# Patient Record
Sex: Male | Born: 1960
Health system: Southern US, Community
[De-identification: ages and names within clinical notes are randomized; demographics above are authoritative.]

## PROBLEM LIST (undated history)

## (undated) DIAGNOSIS — R51 Headache: Secondary | ICD-10-CM

## (undated) DIAGNOSIS — F329 Major depressive disorder, single episode, unspecified: Secondary | ICD-10-CM

## (undated) DIAGNOSIS — G4730 Sleep apnea, unspecified: Secondary | ICD-10-CM

## (undated) DIAGNOSIS — E785 Hyperlipidemia, unspecified: Secondary | ICD-10-CM

## (undated) DIAGNOSIS — Z87828 Personal history of other (healed) physical injury and trauma: Secondary | ICD-10-CM

## (undated) DIAGNOSIS — R569 Unspecified convulsions: Secondary | ICD-10-CM

## (undated) DIAGNOSIS — M21379 Foot drop, unspecified foot: Secondary | ICD-10-CM

## (undated) DIAGNOSIS — G40309 Generalized idiopathic epilepsy and epileptic syndromes, not intractable, without status epilepticus: Secondary | ICD-10-CM

## (undated) DIAGNOSIS — F29 Unspecified psychosis not due to a substance or known physiological condition: Secondary | ICD-10-CM

## (undated) DIAGNOSIS — R948 Abnormal results of function studies of other organs and systems: Secondary | ICD-10-CM

## (undated) DIAGNOSIS — F3289 Other specified depressive episodes: Secondary | ICD-10-CM

## (undated) DIAGNOSIS — R945 Abnormal results of liver function studies: Secondary | ICD-10-CM

## (undated) DIAGNOSIS — Z23 Encounter for immunization: Secondary | ICD-10-CM

## (undated) HISTORY — DX: Abnormal results of function studies of other organs and systems: R94.8

## (undated) HISTORY — DX: Generalized idiopathic epilepsy and epileptic syndromes, not intractable, without status epilepticus: G40.309

## (undated) HISTORY — DX: Abnormal results of liver function studies: R94.5

## (undated) HISTORY — DX: Headache: R51

## (undated) HISTORY — DX: Unspecified psychosis not due to a substance or known physiological condition: F29

## (undated) HISTORY — DX: Major depressive disorder, single episode, unspecified: F32.9

## (undated) HISTORY — DX: Sleep apnea, unspecified: G47.30

## (undated) HISTORY — DX: Personal history of other (healed) physical injury and trauma: Z87.828

## (undated) HISTORY — DX: Unspecified convulsions: R56.9

## (undated) HISTORY — DX: Foot drop, unspecified foot: M21.379

## (undated) HISTORY — DX: Encounter for immunization: Z23

## (undated) HISTORY — DX: Other specified depressive episodes: F32.89

## (undated) HISTORY — DX: Hyperlipidemia, unspecified: E78.5

---

## 1988-06-13 HISTORY — PX: NASAL SINUS SURGERY: SHX719

## 2007-01-17 ENCOUNTER — Emergency Department (HOSPITAL_COMMUNITY): Admission: EM | Admit: 2007-01-17 | Discharge: 2007-01-18 | Payer: Self-pay | Admitting: Emergency Medicine

## 2008-09-05 ENCOUNTER — Encounter: Admission: RE | Admit: 2008-09-05 | Discharge: 2008-09-05 | Payer: Self-pay | Admitting: Neurology

## 2010-12-02 ENCOUNTER — Other Ambulatory Visit: Payer: Self-pay | Admitting: Emergency Medicine

## 2010-12-02 ENCOUNTER — Ambulatory Visit
Admission: RE | Admit: 2010-12-02 | Discharge: 2010-12-02 | Disposition: A | Discharge status: Home / Self Care | Payer: 59 | Source: Ambulatory Visit | Attending: Emergency Medicine | Admitting: Emergency Medicine

## 2010-12-02 DIAGNOSIS — M5030 Other cervical disc degeneration, unspecified cervical region: Secondary | ICD-10-CM

## 2011-03-28 LAB — BASIC METABOLIC PANEL
BUN: 16
CO2: 25
Calcium: 9.1
Creatinine, Ser: 1
GFR calc Af Amer: >60

## 2011-03-28 LAB — RAPID URINE DRUG SCREEN, HOSP PERFORMED
Benzodiazepines: NOT DETECTED
Cocaine: NOT DETECTED
Opiates: NOT DETECTED
Tetrahydrocannabinol: POSITIVE — AB

## 2013-02-15 ENCOUNTER — Other Ambulatory Visit: Payer: Self-pay

## 2013-02-15 MED ORDER — CARBAMAZEPINE 200 MG PO TABS: 400.0000 mg | ORAL_TABLET | Freq: Two times a day (BID) | ORAL | Status: DC

## 2013-05-07 ENCOUNTER — Other Ambulatory Visit: Payer: Self-pay | Admitting: Diagnostic Neuroimaging

## 2013-05-08 NOTE — Telephone Encounter (Signed)
WID 

## 2013-06-11 ENCOUNTER — Other Ambulatory Visit: Payer: Self-pay | Admitting: Neurology

## 2013-06-16 NOTE — Telephone Encounter (Signed)
Former [facility] patient, has not been assigned new provider

## 2013-06-17 ENCOUNTER — Telehealth: Payer: Self-pay | Admitting: Neurology

## 2013-06-17 MED ORDER — CARBAMAZEPINE 200 MG PO TABS: 400.0000 mg | ORAL_TABLET | Freq: Two times a day (BID) | ORAL | Status: DC

## 2013-06-17 NOTE — Telephone Encounter (Signed)
Authorized one refill via WID while patient is waiting for call back regarding appt.

## 2013-06-17 NOTE — Telephone Encounter (Signed)
Gave to Donna for reassignment 

## 2013-06-17 NOTE — Telephone Encounter (Signed)
Patient called stating that he is former Dr. Sandria Kelley patient and no one ever called to reassign him to a new doctor. Patient wanted to schedule an appointment so that he can get Carbamazepine refills. Patient was given 7 day supply by pharmacy. Please call the patient.

## 2013-06-21 ENCOUNTER — Telehealth: Payer: Self-pay | Admitting: *Deleted

## 2013-06-21 MED ORDER — CARBAMAZEPINE 200 MG PO TABS: 400.0000 mg | ORAL_TABLET | Freq: Two times a day (BID) | ORAL | Status: DC

## 2013-06-21 NOTE — Telephone Encounter (Signed)
Rx has been sent  

## 2013-07-05 ENCOUNTER — Telehealth: Payer: Self-pay | Admitting: *Deleted

## 2013-07-05 NOTE — Telephone Encounter (Signed)
I called pt and there is a deadline for his DMV , I moved his appt up for 07-10-13 at 1030.  He will bring form with him.

## 2013-07-10 ENCOUNTER — Ambulatory Visit (INDEPENDENT_AMBULATORY_CARE_PROVIDER_SITE_OTHER): Payer: 59 | Admitting: Diagnostic Neuroimaging

## 2013-07-10 ENCOUNTER — Encounter: Payer: Self-pay | Admitting: Diagnostic Neuroimaging

## 2013-07-10 ENCOUNTER — Encounter (INDEPENDENT_AMBULATORY_CARE_PROVIDER_SITE_OTHER): Payer: Self-pay

## 2013-07-10 VITALS — BP 122/81 | HR 64 | Temp 97.9°F | Ht 71.0 in | Wt 190.0 lb

## 2013-07-10 DIAGNOSIS — G40109 Localization-related (focal) (partial) symptomatic epilepsy and epileptic syndromes with simple partial seizures, not intractable, without status epilepticus: Secondary | ICD-10-CM

## 2013-07-10 LAB — CBC WITH DIFFERENTIAL
BASOS: 0 %
Basophils Absolute: 0 10*3/uL (ref 0.0–0.2)
EOS ABS: 0.1 10*3/uL (ref 0.0–0.4)
Eos: 1 %
HEMATOCRIT: 42.3 % (ref 37.5–51.0)
HEMOGLOBIN: 15.2 g/dL (ref 12.6–17.7)
LYMPHS: 32 %
Lymphocytes Absolute: 2.3 10*3/uL (ref 0.7–3.1)
MCH: 32.8 pg (ref 26.6–33.0)
MCHC: 35.9 g/dL — AB (ref 31.5–35.7)
MCV: 91 fL (ref 79–97)
MONOCYTES: 8 %
Monocytes Absolute: 0.6 10*3/uL (ref 0.1–0.9)
NEUTROS ABS: 4.1 10*3/uL (ref 1.4–7.0)
Neutrophils Relative %: 59 %
Platelets: 248 10*3/uL (ref 150–379)
RBC: 4.64 x10E6/uL (ref 4.14–5.80)
RDW: 13.1 % (ref 12.3–15.4)
WBC: 7 10*3/uL (ref 3.4–10.8)

## 2013-07-10 LAB — COMPREHENSIVE METABOLIC PANEL
A/G RATIO: 2.1 (ref 1.1–2.5)
ALK PHOS: 82 IU/L (ref 39–117)
ALT: 37 IU/L (ref 0–44)
AST: 19 IU/L (ref 0–40)
Albumin: 5.1 g/dL (ref 3.5–5.5)
BILIRUBIN TOTAL: 0.3 mg/dL (ref 0.0–1.2)
BUN / CREAT RATIO: 19 (ref 9–20)
BUN: 13 mg/dL (ref 6–24)
CO2: 27 mmol/L (ref 18–29)
CREATININE: 0.68 mg/dL — AB (ref 0.76–1.27)
Calcium: 9.7 mg/dL (ref 8.7–10.2)
Chloride: 98 mmol/L (ref 96–108)
GFR calc Af Amer: 125 mL/min/{1.73_m2} (ref >59)
GFR, EST NON AFRICAN AMERICAN: 108 mL/min/{1.73_m2} (ref >59)
GLOBULIN, TOTAL: 2.4 g/dL (ref 1.5–4.5)
Glucose: 85 mg/dL (ref 65–99)
Potassium: 4.3 mmol/L (ref 3.5–5.2)
SODIUM: 135 mmol/L (ref 134–144)
Total Protein: 7.5 g/dL (ref 6.0–8.5)

## 2013-07-10 MED ORDER — CARBAMAZEPINE 200 MG PO TABS: 400.0000 mg | ORAL_TABLET | Freq: Two times a day (BID) | ORAL | Status: DC
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
## 2013-07-11 DIAGNOSIS — Z0289 Encounter for other administrative examinations: Secondary | ICD-10-CM

## 2013-09-11 ENCOUNTER — Ambulatory Visit: Payer: Self-pay | Admitting: Diagnostic Neuroimaging

## 2014-07-10 ENCOUNTER — Ambulatory Visit: Payer: 59 | Admitting: Diagnostic Neuroimaging

## 2014-08-01 ENCOUNTER — Encounter: Payer: Self-pay | Admitting: Diagnostic Neuroimaging

## 2014-08-01 ENCOUNTER — Ambulatory Visit (INDEPENDENT_AMBULATORY_CARE_PROVIDER_SITE_OTHER): Payer: 59 | Admitting: Diagnostic Neuroimaging

## 2014-08-01 VITALS — BP 115/81 | HR 77 | Ht 71.0 in | Wt 173.4 lb

## 2014-08-01 DIAGNOSIS — G40109 Localization-related (focal) (partial) symptomatic epilepsy and epileptic syndromes with simple partial seizures, not intractable, without status epilepticus: Secondary | ICD-10-CM

## 2014-08-01 NOTE — Patient Instructions (Signed)
Continue current medications. 
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
## 2014-08-07 ENCOUNTER — Telehealth: Payer: Self-pay | Admitting: *Deleted

## 2014-08-07 ENCOUNTER — Other Ambulatory Visit: Payer: Self-pay | Admitting: Diagnostic Neuroimaging

## 2014-08-07 NOTE — Telephone Encounter (Signed)
Called and left a message for the pt informing him that he still has three refills available according to our system. I told him to call the pharmacy and have them fill the next refill for him. Asked him to call back with any further questions.

## 2014-08-07 NOTE — Telephone Encounter (Signed)
Left a message for the pt and informed him that his medication was in fact a three month supply with 4 refills to last him a year until his next visit. I told him to call back with any further questions

## 2014-08-07 NOTE — Telephone Encounter (Signed)
Patient returned call and stated he received message, he has enough medication at this time.  He wanted to clarify that his Rx for carbamazepine (TEGRETOL) 200 MG tablet was dispensed as a 90 day supply instead of 30 day supply.  FYI

## 2014-09-22 ENCOUNTER — Other Ambulatory Visit: Payer: Self-pay | Admitting: Diagnostic Neuroimaging

## 2015-05-27 ENCOUNTER — Ambulatory Visit (INDEPENDENT_AMBULATORY_CARE_PROVIDER_SITE_OTHER): Payer: 59 | Admitting: Emergency Medicine

## 2015-05-27 VITALS — BP 116/70 | HR 69 | Temp 98.5°F | Resp 16 | Ht 70.0 in | Wt 179.8 lb

## 2015-05-27 DIAGNOSIS — Z23 Encounter for immunization: Secondary | ICD-10-CM

## 2015-05-27 DIAGNOSIS — L02219 Cutaneous abscess of trunk, unspecified: Secondary | ICD-10-CM

## 2015-05-27 MED ORDER — SULFAMETHOXAZOLE-TRIMETHOPRIM 800-160 MG PO TABS: 2.0000 | ORAL_TABLET | Freq: Two times a day (BID) | ORAL | Status: DC

## 2015-05-27 NOTE — Patient Instructions (Signed)

## 2015-05-27 NOTE — Progress Notes (Signed)
Subjective:  Patient ID: Logan Kelley, male    DOB: Nov 10, 1960  Age: 54 y.o. MRN: [id number]  CC: Abdominal Pain; other; and Immunizations   HPI Logan Kelley presents  with a concern that he has a hernia. He has a recent onset mass in his lower portion of his umbilicus that's tender. Yet last night it kept him up until 3:00 with pain and drainage he's been draining a purulent material. He denies any fever chills nausea vomiting has no abdominal pain per se. He has no history of injury. He works at a desk job.  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   He has past surgical history that includes Nasal sinus surgery (1990).   His  family history includes Cancer in his mother; Diabetes in his mother; Heart disease in his mother; Renal Disease in his father; Stroke in his father.  He   reports that he quit smoking about 37 years ago. He has never used smokeless tobacco. He reports that he does not drink alcohol or use illicit drugs.  Outpatient Prescriptions Prior to Visit  Medication Sig Dispense Refill  . carbamazepine (TEGRETOL) 200 MG tablet TAKE 2 TABLETS (400 MG TOTAL) BY MOUTH 2 (TWO) TIMES DAILY. 360 tablet 3  . cholecalciferol (VITAMIN D) 400 UNITS TABS tablet Take 400 Units by mouth.    . Glucosamine Sulfate 500 MG TABS Frequency:   Dosage:0.0     Instructions:  Note:    . Omega-3 Fatty Acids (FISH OIL) 1000 MG CAPS Frequency:   Dosage:0.0     Instructions:  Note:    . aspirin EC 81 MG tablet Take by mouth. Reported on 05/27/2015    . benzonatate (TESSALON) 200 MG capsule Reported on 05/27/2015  0  . fluticasone (FLONASE) 50 MCG/ACT nasal spray 1 spray by Each Nare route daily.    Marland Kitchen  guaiFENesin (MUCINEX) 600 MG 12 hr tablet Take 600 mg by mouth. Reported on 05/27/2015    . HYDROcodone-homatropine (HYCODAN) 5-1.5 MG/5ML syrup Take by mouth. Reported on 05/27/2015    . Multiple Vitamin (MULTIVITAMIN) capsule Reported on 05/27/2015    . Sodium Chloride-Sodium Bicarb (SINUFLO READYRINSE) KIT Reported on 05/27/2015  0   No facility-administered medications prior to visit.    Social History   Social History  . Marital Status: Married    Spouse Name: N/A  . Number of Children: 2  . Years of Education: 17   Occupational History  .     Social History Main Topics  . Smoking status: Former Smoker    Quit date: 07/10/1977  . Smokeless tobacco: Never Used  . Alcohol Use: No  . Drug Use: No  . Sexual Activity: Not Asked   Other Topics Concern  . None   Social History Narrative   Patient is right handed and resides in home with family     Review of Systems  Constitutional: Negative for fever, chills and appetite change.  HENT: Positive for postnasal drip, rhinorrhea and sinus pressure. Negative for congestion, ear pain and sore throat.   Eyes: Negative for pain and redness.  Respiratory: Positive for cough. Negative for shortness of breath and wheezing.   Cardiovascular: Negative for leg swelling.  Gastrointestinal: Negative for nausea, vomiting, abdominal pain, diarrhea, constipation and blood in stool.  Endocrine: Negative for polyuria.  Genitourinary: Negative for dysuria, urgency, frequency and flank pain.  Musculoskeletal: Negative for gait problem.  Skin: Positive for color change. Negative for rash.  Neurological: Negative for weakness and headaches.  Psychiatric/Behavioral: Negative for confusion and decreased concentration. The patient is not nervous/anxious.     Objective:  BP 116/70 mmHg  Pulse 69  Temp(Src) 98.5 F (36.9 C) (Oral)  Resp 16  Ht _0  (1.778 m)  Wt 179 lb 12.8 oz (81.557 kg)  BMI 25.80 kg/m2  SpO2 98%  Physical Exam    Constitutional: He is oriented to person, place, and time. He appears well-developed and well-nourished. No distress.  HENT:  Head: Normocephalic and atraumatic.  Right Ear: External ear normal.  Left Ear: External ear normal.  Nose: Nose normal.  Eyes: Conjunctivae and EOM are normal. Pupils are equal, round, and reactive to light. No scleral icterus.  Neck: Normal range of motion. Neck supple. No tracheal deviation present.  Cardiovascular: Normal rate, regular rhythm and normal heart sounds.   Pulmonary/Chest: Effort normal. No respiratory distress. He has no wheezes. He has no rales.  Abdominal: He exhibits no mass. There is no tenderness. There is no rebound and no guarding.  Musculoskeletal: He exhibits no edema.  Lymphadenopathy:    He has no cervical adenopathy.  Neurological: He is alert and oriented to person, place, and time. Coordination normal.  Skin: Skin is warm and dry. Rash noted. Rash is pustular.  Psychiatric: He has a normal mood and affect. His behavior is normal.    He has cellulitis surrounding the umbilicus with small abscess on the inferior portion of the umbilicus itself. It's freely freely draining pus is abdomen soft and nontender there is no mass  Assessment & Plan:   Logan Kelley was seen today for abdominal pain, other and immunizations.  Diagnoses and all orders for this visit:  Abscess of trunk  Needs flu shot -     Flu Vaccine QUAD 36+ mos IM  Other orders -     sulfamethoxazole-trimethoprim (BACTRIM DS,SEPTRA DS) 800-160 MG tablet; Take 2 tablets by mouth 2 (two) times daily.  I am having Logan Kelley start on sulfamethoxazole-trimethoprim. I am also having him maintain his aspirin EC, Fish Oil, fluticasone, Glucosamine Sulfate, guaiFENesin, HYDROcodone-homatropine, multivitamin, SINUFLO READYRINSE, benzonatate, cholecalciferol, and carbamazepine.  Meds ordered this encounter  Medications  . sulfamethoxazole-trimethoprim (BACTRIM DS,SEPTRA DS)  800-160 MG tablet    Sig: Take 2 tablets by mouth 2 (two) times daily.    Dispense:  40 tablet    Refill:  0    Appropriate red flag conditions were discussed with the patient as well as actions that should be taken.  Patient expressed his understanding.  Follow-up: Return if symptoms worsen or fail to improve.  Roselee Culver, MD

## 2015-08-07 ENCOUNTER — Ambulatory Visit: Payer: 59 | Admitting: Diagnostic Neuroimaging

## 2015-08-27 ENCOUNTER — Encounter: Payer: Self-pay | Admitting: Diagnostic Neuroimaging

## 2015-08-27 ENCOUNTER — Ambulatory Visit (INDEPENDENT_AMBULATORY_CARE_PROVIDER_SITE_OTHER): Payer: 59 | Admitting: Diagnostic Neuroimaging

## 2015-08-27 VITALS — BP 123/82 | HR 63 | Ht 70.5 in | Wt 181.2 lb

## 2015-08-27 DIAGNOSIS — G40109 Localization-related (focal) (partial) symptomatic epilepsy and epileptic syndromes with simple partial seizures, not intractable, without status epilepticus: Secondary | ICD-10-CM

## 2015-08-27 MED ORDER — CARBAMAZEPINE 200 MG PO TABS: 400.0000 mg | ORAL_TABLET | Freq: Two times a day (BID) | ORAL | Status: DC

## 2015-08-27 NOTE — Patient Instructions (Signed)
Thank you for coming to see [place] at [facility]. I hope we have been able to provide you high quality care today.  You may receive a patient satisfaction survey over the next few weeks. We would appreciate your feedback and comments so that we may continue to improve ourselves and the health of our patients.  - continue current medication   ~~~~~~~~~~~~~~~~~~~~~~~~~~~~~~~~~~~~~~~~~~~~~~~~~~~~~~~~~~~~~~~~~  DR. PENUMALLI'S GUIDE TO HAPPY AND HEALTHY LIVING These are some of my general health and wellness recommendations. Some of them may apply to you better than others. Please use common sense as you try these suggestions and feel free to ask me any questions.   ACTIVITY/FITNESS Mental, social, emotional and physical stimulation are very important for brain and body health. Try learning a new activity (arts, music, language, sports, games).  Keep moving your body to the best of your abilities. You can do this at home, inside or outside, the park, community center, gym or anywhere you like. Consider a physical therapist or personal trainer to get started. Consider the app Sworkit. Fitness trackers such as smart-watches, smart-phones or Fitbits can help as well.   NUTRITION Eat more plants: colorful vegetables, nuts, seeds and berries.  Eat less sugar, salt, preservatives and processed foods.  Avoid toxins such as cigarettes and alcohol.  Drink water when you are thirsty. Warm water with a slice of lemon is an excellent morning drink to start the day.  Consider these websites for more information The Nutrition Source (https://www.henry-hernandez.biz/) Precision Nutrition (WindowBlog.ch)   RELAXATION Consider practicing mindfulness meditation or other relaxation techniques such as deep breathing, prayer, yoga, tai chi, massage. See website mindful.org or the apps Headspace or Calm to help get started.   SLEEP Try to get at  least 7-8+ hours sleep per day. Regular exercise and reduced caffeine will help you sleep better. Practice good sleep hygeine techniques. See website sleep.org for more information.   PLANNING Prepare estate planning, living will, healthcare POA documents. Sometimes this is best planned with the help of an attorney. Theconversationproject.org and agingwithdignity.org are excellent resources.
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
## 2015-09-16 ENCOUNTER — Other Ambulatory Visit: Payer: Self-pay | Admitting: Diagnostic Neuroimaging

## 2015-11-02 ENCOUNTER — Encounter: Payer: Self-pay | Admitting: *Deleted

## 2015-11-02 DIAGNOSIS — Z0289 Encounter for other administrative examinations: Secondary | ICD-10-CM

## 2015-11-02 NOTE — Progress Notes (Unsigned)
DMV papers completed, sent to MR for processing.

## 2015-11-19 ENCOUNTER — Telehealth: Payer: Self-pay | Admitting: *Deleted

## 2015-11-19 NOTE — Telephone Encounter (Signed)
[facility]

## 2015-11-23 ENCOUNTER — Telehealth: Payer: Self-pay | Admitting: *Deleted

## 2015-11-23 NOTE — Telephone Encounter (Signed)
DMV papers completed, sent to MR for processing.

## 2015-11-25 ENCOUNTER — Telehealth: Payer: Self-pay | Admitting: *Deleted

## 2015-11-25 NOTE — Telephone Encounter (Signed)
[facility]

## 2016-08-08 ENCOUNTER — Telehealth: Payer: Self-pay | Admitting: *Deleted

## 2016-08-08 NOTE — Telephone Encounter (Signed)
LVM requesting patient call back and reschedule March FU due to dr being out of the office. Left office number and advised the phone staff may reschedule for him.

## 2016-08-26 ENCOUNTER — Ambulatory Visit: Payer: 59 | Admitting: Diagnostic Neuroimaging

## 2016-08-30 ENCOUNTER — Telehealth: Payer: Self-pay | Admitting: *Deleted
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
## 2016-08-31 ENCOUNTER — Ambulatory Visit (INDEPENDENT_AMBULATORY_CARE_PROVIDER_SITE_OTHER): Payer: BLUE CROSS/BLUE SHIELD | Admitting: Diagnostic Neuroimaging

## 2016-08-31 ENCOUNTER — Encounter: Payer: Self-pay | Admitting: Diagnostic Neuroimaging

## 2016-08-31 ENCOUNTER — Encounter: Payer: Self-pay | Admitting: *Deleted

## 2016-08-31 ENCOUNTER — Encounter (INDEPENDENT_AMBULATORY_CARE_PROVIDER_SITE_OTHER): Payer: Self-pay

## 2016-08-31 VITALS — BP 122/78 | HR 69 | Wt 184.0 lb

## 2016-08-31 DIAGNOSIS — G40109 Localization-related (focal) (partial) symptomatic epilepsy and epileptic syndromes with simple partial seizures, not intractable, without status epilepticus: Secondary | ICD-10-CM

## 2016-08-31 MED ORDER — CARBAMAZEPINE 200 MG PO TABS: 400.0000 mg | ORAL_TABLET | Freq: Two times a day (BID) | ORAL | 4 refills | Status: DC
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
## 2016-08-31 NOTE — Patient Instructions (Signed)
-   continue carbamazepine 

## 2017-03-06 ENCOUNTER — Ambulatory Visit: Payer: Self-pay

## 2017-03-06 ENCOUNTER — Ambulatory Visit: Payer: BLUE CROSS/BLUE SHIELD | Attending: Family Medicine | Admitting: Rehabilitation

## 2017-03-06 DIAGNOSIS — M6281 Muscle weakness (generalized): Secondary | ICD-10-CM | POA: Insufficient documentation

## 2017-03-06 DIAGNOSIS — M25511 Pain in right shoulder: Secondary | ICD-10-CM | POA: Diagnosis present

## 2017-03-06 NOTE — Therapy (Signed)
[facility] [address] [place], [place], [address] Phone: [phone]   Fax:  [phone]  Physical Therapy Evaluation  Patient Details  Name: MACKY GALIK MRN: [id number] Date of Birth: 17-Apr-1961 Referring Provider: Mare Loan, MD  Encounter Date: 03/06/2017      PT End of Session - 03/06/17 1731    Visit Number 1   Number of Visits 10   Date for PT Re-Evaluation 04/10/17   PT Start Time 1550   PT Stop Time 1630   PT Time Calculation (min) 40 min   Activity Tolerance Patient tolerated treatment well      Past Medical History:  Diagnosis Date  . Depressive disorder, not elsewhere classified   . 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 Headache(784.0)   . History of traumatic head injury    Age 56  . Liver function abnormality   . Nonspecific abnormal results of other specified function study   . Seizures (HCC)    08/31/16  last sz > 15 yrs  . Unspecified psychosis     Past Surgical History:  Procedure Laterality Date  . NASAL SINUS SURGERY  1990   polyps removed    There were no vitals filed for this visit.       Subjective Assessment - 03/06/17 1554    Subjective Pt presents with R shoulder pain and decreasing function x 2 weeks for no known reason.  Saw primary care MD who gave him naproxen and told him to ice and use heat.  Now having difficulty using the UE, lifting the arm, playing the guitar, and using the mouse at work.  Denies numbness and tingling, Has a TENS unit and is using antinflammatories.  Attempted pendulums and wall walking at home which aggravated the shoulder.     Limitations Lifting   Diagnostic tests none   Patient Stated Goals return to full use of the arm    Currently in Pain? Yes   Pain Score 2   up to 9/10   Pain Location Shoulder   Pain Orientation Right   Pain Descriptors / Indicators Aching;Throbbing   Pain Type Acute pain   Pain Onset 1  to 4 weeks ago   Pain Frequency Constant   Aggravating Factors  using the UE   Pain Relieving Factors ice, heat, TENS            [facility] PT Assessment - 03/06/17 0001      Assessment   Medical Diagnosis R shoulder pain   Referring Provider Mare Loan, MD   Onset Date/Surgical Date 02/20/17   Hand Dominance Right   Next MD Visit unknown   Prior Therapy no     Precautions   Precautions None     Restrictions   Weight Bearing Restrictions No     Balance Screen   Has the patient fallen in the past 6 months No     Home Environment   Living Environment Private residence     Prior Function   Level of Independence Independent   Vocation Requirements desk at [employer]   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     Observation/Other Assessments   Focus on Therapeutic Outcomes (FOTO)  not taken     ROM / Strength   AROM / PROM / Strength AROM;PROM;Strength     AROM   AROM Assessment Site Shoulder   Right/Left Shoulder Right   Right Shoulder Flexion 70 Degrees  pain   Right Shoulder ABduction 55 Degrees  pain   Right Shoulder Internal Rotation --  behind the back to T10 pain   Right Shoulder External Rotation --  behind head 85%     PROM   Overall PROM Comments all WNL without significant pain   PROM Assessment Site Shoulder   Right/Left Shoulder Right     Strength   Overall Strength Comments within available ROM   Strength Assessment Site Shoulder   Right/Left Shoulder Right   Right Shoulder Flexion 3+/5   Right Shoulder ABduction 3+/5   Right Shoulder Internal Rotation 4+/5   Right Shoulder External Rotation 3+/5     Palpation   Palpation comment 1 ttp supraspinatus and infraspinatus tendon insertions     Special Tests    Special Tests Rotator Cuff Impingement            Objective measurements completed on examination: See above findings.                  PT Education - 03/06/17 1731    Education provided Yes   Education  Details HEP, diagnosis, POC, when to see orthopedic surgeon   Person(s) Educated Patient   Methods Explanation;Demonstration;Verbal cues;Handout   Comprehension Verbalized understanding;Returned demonstration;Verbal cues required             PT Long Term Goals - 03/06/17 1738      PT LONG TERM GOAL #1   Title Pt will improve shoulder AROM to WNL    Time 5   Period Weeks   Status New   Target Date 04/10/17     PT LONG TERM GOAL #2   Title Pt will improve R shoulder strength to 4+/5 or greater   Time 5   Period Weeks   Status New   Target Date 04/10/17     PT LONG TERM GOAL #3   Title Pt will return to playing the guitar without limitations   Time 5   Period Weeks   Status New   Target Date 04/10/17                Plan - 03/06/17 1733    Clinical Impression Statement pt presents with new onset R shoulder pain x 2 weeks for no known reason.  The pain was initially very severe constantly but is now only severe at night and with use.  The R UE AROM is very limited against gravity into flexion, abduction with 3+/5 MMT flexion, abduction, and ER.  PROM is full but slightly painful.  Educated on attempting TE x 1 week with visit to orthopedic surgeon for imaging/follow-up if the ROM has not improved.  Pt demonstrating likely RTC at this point.     Clinical Presentation Stable   Clinical Decision Making Low   Rehab Potential Good   PT Frequency --  1-2 x per week    PT Duration --  5 weeks   PT Treatment/Interventions Manual techniques;Passive range of motion;Ultrasound;Iontophoresis /ml Dexamethasone;Electrical Stimulation;Cryotherapy;Moist Heat;Therapeutic exercise;Neuromuscular re-education   PT Next Visit Plan review HEP; assess AROM, begin ROM/R shoulder strengthening as tolerated.  Modalities as needed   PT Home Exercise Plan Given AAROM standing abduction, flexion, supine punch, supine cane flexion, supine active flexion   Recommended Other Services  orthopedic if no quick improvements in AROM   Consulted and Agree with Plan of Care Patient      Patient will benefit from skilled therapeutic intervention in order to improve the following deficits  and impairments:  Decreased range of motion, Pain, Decreased strength  Visit Diagnosis: Acute pain of right shoulder - Plan: PT plan of care cert/re-cert  Muscle weakness (generalized) - Plan: PT plan of care cert/re-cert     Problem List There are no active problems to display for this patient.   Idamae Lusher 03/06/2017, 5:43 PM  [facility] [address] [place], [place], [address] Phone: [phone]   Fax:  [phone]  Name: ZYHIR CAPPELLA MRN: [id number] Date of Birth: 1961/01/19

## 2017-03-06 NOTE — Patient Instructions (Signed)
   With no resistance

## 2017-03-16 ENCOUNTER — Ambulatory Visit: Payer: BLUE CROSS/BLUE SHIELD | Attending: Family Medicine

## 2017-03-16 DIAGNOSIS — M25611 Stiffness of right shoulder, not elsewhere classified: Secondary | ICD-10-CM | POA: Diagnosis present

## 2017-03-16 DIAGNOSIS — M25511 Pain in right shoulder: Secondary | ICD-10-CM | POA: Diagnosis present

## 2017-03-16 DIAGNOSIS — M6281 Muscle weakness (generalized): Secondary | ICD-10-CM | POA: Insufficient documentation

## 2017-03-16 NOTE — Therapy (Signed)
[facility] [address] [place], [place], [address] Phone: [phone]   Fax:  [phone]  Physical Therapy Treatment  Patient Details  Name: Logan Kelley MRN: [id number] Date of Birth: Nov 01, 1960 Referring Provider: Mare Loan, MD  Encounter Date: 03/16/2017      PT End of Session - 03/16/17 1727    Visit Number 2   Number of Visits 10   Date for PT Re-Evaluation 04/10/17   PT Start Time 0440   PT Stop Time 0520   PT Time Calculation (min) 40 min   Activity Tolerance Patient tolerated treatment well;No increased pain   Behavior During Therapy WFL for tasks assessed/performed      Past Medical History:  Diagnosis Date  . Depressive disorder, not elsewhere classified   . 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 Headache(784.0)   . History of traumatic head injury    Age 56  . Liver function abnormality   . Nonspecific abnormal results of other specified function study   . Seizures (HCC)    08/31/16  last sz > 15 yrs  . Unspecified psychosis     Past Surgical History:  Procedure Laterality Date  . NASAL SINUS SURGERY  1990   polyps removed    There were no vitals filed for this visit.      Subjective Assessment - 03/16/17 1643    Subjective Feels he is improving. Doing HEP.   Modified work set up. Not playing guitar now on weekends   Currently in Pain? No/denies  at rest, pain in AM now,                            [facility] Adult PT Treatment/Exercise - 03/16/17 0001      Exercises   Exercises Shoulder     Shoulder Exercises: Seated   Other Seated Exercises Reviewed initial HEP     Shoulder Exercises: Standing   Other Standing Exercises [place] yellow and red band x 12-15 reps with instruction for HEP      Shoulder Exercises: Stretch   Internal Rotation Stretch 20 seconds   Internal Rotation Stretch Limitations with towel instruction for HEP   Wall  Stretch - Flexion 2 reps;30 seconds   Wall Stretch - Flexion Limitations reaching and moving body into corner of walls instructed for HEP                PT Education - 03/16/17 1724    Education provided Yes   Education Details isued HEP for IR and ER /flex/abduction with towel and use of wall 2x/day 2-3 reps 30 sec or more and rockwood with yellow and red band  2x/day 10-15 reps NO PAIN.   Person(s) Educated Patient   Methods Explanation;Demonstration;Verbal cues;Handout;Tactile cues   Comprehension Verbalized understanding;Returned demonstration             PT Long Term Goals - 03/06/17 1738      PT LONG TERM GOAL #1   Title Pt will improve shoulder AROM to WNL    Time 5   Period Weeks   Status New   Target Date 04/10/17     PT LONG TERM GOAL #2   Title Pt will improve R shoulder strength to 4+/5 or greater   Time 5   Period Weeks   Status New   Target Date 04/10/17     PT LONG TERM GOAL #3  Title Pt will return to playing the guitar without limitations   Time 5   Period Weeks   Status New   Target Date 04/10/17               Plan - 03/16/17 1728    Clinical Impression Statement He is much improved with essentially no pain with ROM and passive motion WNL except reaching behind back.   He is still weak not able to lift arm to 90 degrees.  And having difficulty with flexion and ER with band exercises   PT Treatment/Interventions Manual techniques;Passive range of motion;Ultrasound;Iontophoresis /ml Dexamethasone;Electrical Stimulation;Cryotherapy;Moist Heat;Therapeutic exercise;Neuromuscular re-education   PT Next Visit Plan review HEP; assess AROM, begin ROM/R shoulder strengthening as tolerated.  Modalities as needed   PT Home Exercise Plan Given AAROM standing abduction, flexion, supine punch, supine cane flexion, supine active flexion, stretching overhead and behind back, rockwood   Consulted and Agree with Plan of Care Patient      Patient  will benefit from skilled therapeutic intervention in order to improve the following deficits and impairments:  Decreased range of motion, Pain, Decreased strength  Visit Diagnosis: Acute pain of right shoulder  Muscle weakness (generalized)     Problem List There are no active problems to display for this patient.   Caprice Red  PT 03/16/2017, 5:35 PM  [facility] [address] [place], [place], [address] Phone: [phone]   Fax:  [phone]  Name: Logan Kelley MRN: [id number] Date of Birth: Aug 20, 1960

## 2017-03-21 ENCOUNTER — Ambulatory Visit: Payer: BLUE CROSS/BLUE SHIELD

## 2017-03-21 DIAGNOSIS — M25611 Stiffness of right shoulder, not elsewhere classified: Secondary | ICD-10-CM

## 2017-03-21 DIAGNOSIS — M25511 Pain in right shoulder: Secondary | ICD-10-CM | POA: Diagnosis not present

## 2017-03-21 DIAGNOSIS — M6281 Muscle weakness (generalized): Secondary | ICD-10-CM

## 2017-03-21 NOTE — Therapy (Signed)
[place], [place], [address] Phone: [phone]   Fax:  [phone]  Physical Therapy Treatment  Patient Details  Name: Logan Kelley MRN: [id number] Date of Birth: 11/04/1960 Referring Provider: Marlane Mingle, MD  Encounter Date: 03/21/2017      PT End of Session - 03/21/17 1634    Visit Number 3   Number of Visits 10   Date for PT Re-Evaluation 04/10/17   PT Start Time 0435   PT Stop Time 0520   PT Time Calculation (min) 45 min   Activity Tolerance Patient tolerated treatment well   Behavior During Therapy [facility] for tasks assessed/performed      Past Medical History:  Diagnosis Date  . Depressive disorder, not elsewhere classified   . 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 Headache(784.0)   . History of traumatic head injury    Age 56  . Liver function abnormality   . Nonspecific abnormal results of other specified function study   . Seizures ([place])    08/31/16  last sz > 15 yrs  . Unspecified psychosis     Past Surgical History:  Procedure Laterality Date  . NASAL SINUS SURGERY  1990   polyps removed    There were no vitals filed for this visit.      Subjective Assessment - 03/21/17 1637    Subjective Pain after playing the guitar this weekend , No pain right now.   Currently in Pain? No/denies                         [facility] Adult PT Treatment/Exercise - 03/21/17 0001      Shoulder Exercises: Supine   Flexion Strengthening;Right;15 reps   Shoulder Flexion Weight (lbs) 2   Flexion Limitations cued for eeping elbow extended     Shoulder Exercises: Sidelying   Other Sidelying Exercises able to actively move full ROM but with 2 pound unable to go more than 1/3 of range and not able to hold in full range and control eccentrics     Shoulder Exercises: Standing   Other Standing Exercises rockwood with red band most trouble with ER.   Also added hand sbehind back abduction pulld red band for home x 15 reps      Shoulder Exercises: Pulleys   Flexion 2 minutes   Flexion Limitations overhead     Shoulder Exercises: ROM/Strengthening   UBE (Upper Arm Bike) L1 5 min forward     Manual Therapy   Manual Therapy Passive ROM   Passive ROM Range all planes . ROM WNLexcept reacg=ching behind back still 2 inches less on RT no incr pain                PT Education - 03/21/17 1725    Education provided Yes   Education Details HEP   Person(s) Educated Patient   Methods Explanation;Tactile cues;Verbal cues;Handout;Demonstration   Comprehension Returned demonstration;Verbalized understanding             PT Long Term Goals - 03/21/17 1727      PT LONG TERM GOAL #1   Title Pt will improve shoulder AROM to WNL    Status On-going     PT LONG TERM GOAL #2   Title Pt will improve R shoulder strength to 4+/5 or greater   Baseline ER and abduction /flexion still < 3/5   Status On-going     PT  LONG TERM GOAL #3   Title Pt will return to playing the guitar without limitations   Baseline some pain but managable with playing   Status Partially Met               Plan - 03/21/17 1634    Clinical Impression Statement No pain to start. Still stiff RT shoulder with ER. Able to lift some weight but abduction and ER still very weak and may have RTC tear.    PT Treatment/Interventions Manual techniques;Passive range of motion;Ultrasound;Iontophoresis 85m/ml Dexamethasone;Electrical Stimulation;Cryotherapy;Moist Heat;Therapeutic exercise;Neuromuscular re-education   PT Next Visit Plan  Next visit next Tuesday.  review HEP; assess AROM, R shoulder strengthening as tolerated.  Modalities as needed   PT Home Exercise Plan Given AAROM standing abduction, flexion, supine punch, supine cane flexion, supine active flexion, stretching overhead and behind back, rockwood, behind back abduction , supine flexion with weight , side  lye ER.    Consulted and Agree with Plan of Care Patient      Patient will benefit from skilled therapeutic intervention in order to improve the following deficits and impairments:  Decreased range of motion, Pain, Decreased strength  Visit Diagnosis: Muscle weakness (generalized)  Acute pain of right shoulder  Stiffness of right shoulder joint     Problem List There are no active problems to display for this patient.   CDarrel Hoover PT 03/21/2017, 5:29 PM  [facility] [address]Phone: [phone]  Fax:  [phone] Name: Logan DINOVOMRN: [id number] of Birth: [birth year]

## 2017-03-21 NOTE — Patient Instructions (Signed)
Written instruction for behind back abduction with band, ER on side and flexion in supine with can for strength 1-2x/day 10-20 reps , no pain

## 2017-03-28 ENCOUNTER — Ambulatory Visit: Payer: BLUE CROSS/BLUE SHIELD

## 2017-03-28 DIAGNOSIS — M25511 Pain in right shoulder: Secondary | ICD-10-CM | POA: Diagnosis not present

## 2017-03-28 DIAGNOSIS — M25611 Stiffness of right shoulder, not elsewhere classified: Secondary | ICD-10-CM

## 2017-03-28 DIAGNOSIS — M6281 Muscle weakness (generalized): Secondary | ICD-10-CM

## 2017-03-28 NOTE — Patient Instructions (Signed)
Written for side lying and prone hor abduction and prone extension  10-15 reps assisted as needed and attempting isometric holding.

## 2017-03-28 NOTE — Therapy (Signed)
[place], [place], [address] Phone: [phone]   Fax:  [phone]  Physical Therapy Treatment  Patient Details  Name: Logan Kelley MRN: [id number] Date of Birth: [birth year] Referring Provider: Marlane Mingle, MD  Encounter Date: 03/28/2017      PT End of Session - 03/28/17 1639    Visit Number 4   Number of Visits 10   Date for PT Re-Evaluation 04/10/17   PT Start Time 0435   PT Stop Time 0520   PT Time Calculation (min) 45 min   Activity Tolerance Patient tolerated treatment well   Behavior During Therapy [place] for tasks assessed/performed      Past Medical History:  Diagnosis Date  . Depressive disorder, not elsewhere classified   . 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 Headache(784.0)   . History of traumatic head injury    Age 56  . Liver function abnormality   . Nonspecific abnormal results of other specified function study   . Seizures ([place])    08/31/16  last sz > 15 yrs  . Unspecified psychosis     Past Surgical History:  Procedure Laterality Date  . NASAL SINUS SURGERY  1990   polyps removed    There were no vitals filed for this visit.      Subjective Assessment - 03/28/17 1638    Subjective Better in that i can raise arm without thinking now  and can play guitar without pain.   still hard to reach fully overhead and to ER.    Currently in Pain? No/denies                         [facility] Adult PT Treatment/Exercise - 03/28/17 0001      Shoulder Exercises: Supine   External Rotation Right;12 reps   Internal Rotation Right;12 reps   Internal Rotation Weight (lbs) 2   Internal Rotation Limitations cued for control   Flexion Right   Shoulder Flexion Weight (lbs) 2   Flexion Limitations cued for keeping elbow extended     Shoulder Exercises: Prone   Other Prone Exercises Extension full ROM active but decreased range with 1  and 2 pound weight.  and hor abduction unable to lift without assistance.      Shoulder Exercises: Sidelying   Other Sidelying Exercises able to actively move full ROM but with 1 and  2 pound unable to go more than 1/3 of range and not able to hold in full range and control eccentrics.. Also did horizontal abdcution  also with asssit and di 4-5 reps without assist but very weak.      Shoulder Exercises: Standing   Other Standing Exercises rockwood with green band most trouble with ER.  Also added hand sbehind back abduction pulld red band for home x 15 reps      Shoulder Exercises: ROM/Strengthening   UBE (Upper Arm Bike) l2 3 min forward , 3 min back                PT Education - 03/28/17 1735    Education provided Yes   Education Details HEP   Person(s) Educated Patient   Methods Explanation;Tactile cues;Verbal cues;Handout   Comprehension Returned demonstration;Verbalized understanding             PT Long Term Goals - 03/28/17 1737      PT LONG TERM GOAL #1   Status On-going  PT LONG TERM GOAL #2   Status On-going     PT LONG TERM GOAL #3   Title Pt will return to playing the guitar without limitations   Status Partially Met               Plan - 03/28/17 1639    Clinical Impression Statement Continues with weakness with flexion /abduction/ER. but functionally improveing with less pain. Suggested he speak to MD about testing for rotator cuff tear but to continue towork in PT for awhile to see if strength can be improved   PT Treatment/Interventions Manual techniques;Passive range of motion;Ultrasound;Iontophoresis 71m/ml Dexamethasone;Electrical Stimulation;Cryotherapy;Moist Heat;Therapeutic exercise;Neuromuscular re-education   PT Next Visit Plan continue strength and ROM    PT Home Exercise Plan Given AAROM standing abduction, flexion, supine punch, supine cane flexion, supine active flexion, stretching overhead and behind back, rockwood, behind back  abduction , supine flexion with weight , side lye ER.  and abduction  and hor abduction and prone ext and hor abd   Consulted and Agree with Plan of Care Patient      Patient will benefit from skilled therapeutic intervention in order to improve the following deficits and impairments:  Decreased range of motion, Pain, Decreased strength  Visit Diagnosis: Muscle weakness (generalized)  Acute pain of right shoulder  Stiffness of right shoulder joint     Problem List There are no active problems to display for this patient.   CDarrel Hoover PT 03/28/2017, 5:37 PM  [facility] [address]Phone: [phone]  Fax:  [phone] Name: Logan HAYASHIMRN: [id number] of Birth: [birth year]

## 2017-04-04 ENCOUNTER — Ambulatory Visit: Payer: BLUE CROSS/BLUE SHIELD

## 2017-04-04 DIAGNOSIS — M25611 Stiffness of right shoulder, not elsewhere classified: Secondary | ICD-10-CM

## 2017-04-04 DIAGNOSIS — M25511 Pain in right shoulder: Secondary | ICD-10-CM

## 2017-04-04 DIAGNOSIS — M6281 Muscle weakness (generalized): Secondary | ICD-10-CM

## 2017-04-04 NOTE — Therapy (Addendum)
[facility], [place], [address] Phone: [phone]   Fax:  [phone]  Physical Therapy Treatment/Discharge  Patient Details  Name: Logan Kelley MRN: [id number] Date of Birth: 26-May-1961 Referring Provider: Marlane Mingle, MD  Encounter Date: 04/04/2017      PT End of Session - 04/04/17 1737    Visit Number 5   Number of Visits 10   Date for PT Re-Evaluation 04/10/17   PT Start Time 0435   PT Stop Time 0515   PT Time Calculation (min) 40 min   Activity Tolerance Patient tolerated treatment well;No increased pain   Behavior During Therapy WFL for tasks assessed/performed      Past Medical History:  Diagnosis Date  . Depressive disorder, not elsewhere classified   . 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 Headache(784.0)   . History of traumatic head injury    Age 56  . Liver function abnormality   . Nonspecific abnormal results of other specified function study   . Seizures ([facility])    08/31/16  last sz > 15 yrs  . Unspecified psychosis     Past Surgical History:  Procedure Laterality Date  . NASAL SINUS SURGERY  1990   polyps removed    There were no vitals filed for this visit.      Subjective Assessment - 04/04/17 1635    Subjective Rough week with pain . This weekend did 90 songs  over weekend. Used strap and had RT elbow and forearm pain.   Decr with asprin.     Currently in Pain? No/denies            [facility] PT Assessment - 04/04/17 0001      AROM   Right Shoulder Flexion 90 Degrees  in sitting , standing  65 degrees    Right Shoulder ABduction 98 Degrees  in sitting  standing 63   Right Shoulder Internal Rotation --  T 12  no pain.       Right Shoulder External Rotation 90 Degrees     PROM   Overall PROM Comments all WNL without significant pain     Strength   Right Shoulder Flexion 3-/5   Right Shoulder ABduction 3-/5   Right Shoulder  Internal Rotation 4+/5   Right Shoulder External Rotation 3/5                     [facility] Adult PT Treatment/Exercise - 04/04/17 0001      Shoulder Exercises: Prone   Other Prone Exercises Extension full ROM active but decreased range with 1 and 2 pound weight.  and hor abduction unable to lift without assistance.  Worked on asssited ROm  to end range     Shoulder Exercises: Sidelying   Other Sidelying Exercises worked again on ER and abduction . able to do fullROM abduct ,, 1/2 range ER.                      PT Long Term Goals - 04/04/17 1739      PT LONG TERM GOAL #1   Title Pt will improve shoulder AROM to WNL    Status On-going     PT LONG TERM GOAL #2   Title Pt will improve R shoulder strength to 4+/5 or greater   Status On-going     PT LONG TERM GOAL #3   Title Pt will return to playing the  guitar without limitations   Status Partially Met               Plan - 04/04/17 1738    Clinical Impression Statement No changes though x 1-2 able to flex and abduct wiht incr ROM.   He is seeing Ortho MD this Friday so will hold PT until after Ortho MD sees him and decides on next step MRI or mor e PT   PT Treatment/Interventions Manual techniques;Passive range of motion;Ultrasound;Iontophoresis 40m/ml Dexamethasone;Electrical Stimulation;Cryotherapy;Moist Heat;Therapeutic exercise;Neuromuscular re-education   PT Next Visit Plan Hold unitl Ortho MD decides on next step   PT Home Exercise Plan Given AAROM standing abduction, flexion, supine punch, supine cane flexion, supine active flexion, stretching overhead and behind back, rockwood, behind back abduction , supine flexion with weight , side lye ER.  and abduction  and hor abduction and prone ext and hor abd   Consulted and Agree with Plan of Care Patient      Patient will benefit from skilled therapeutic intervention in order to improve the following deficits and impairments:  Decreased range of  motion, Pain, Decreased strength  Visit Diagnosis: Muscle weakness (generalized)  Acute pain of right shoulder  Stiffness of right shoulder joint     Problem List There are no active problems to display for this patient.   CDarrel Hoover PT 04/04/2017, 5:40 PM  [facility] [address]Phone: [phone]  Fax:  [phone] Name: Logan POPOWSKIMRN: [id number] of Birth: 1September 06, 1962 PHYSICAL THERAPY DISCHARGE SUMMARY  Visits from Start of Care: 5  Current functional level related to goals / functional outcomes: See above . He went for an MRI and had cervical MRI but may need MRI to shoulder for rotator cuff assessment. He has not returned. Would be glad to see him if needed in future   Remaining deficits: See above   Education / Equipment: HEP Plan:                                                    Patient goals were partially met. Patient is being discharged due to not returning since the last visit.  ?????  SNoralee StainPT 05/18/17

## 2017-06-14 ENCOUNTER — Other Ambulatory Visit: Payer: Self-pay

## 2017-06-14 ENCOUNTER — Emergency Department (HOSPITAL_COMMUNITY)
Admission: EM | Admit: 2017-06-14 | Discharge: 2017-06-15 | Disposition: A | Discharge status: Home / Self Care | Payer: BLUE CROSS/BLUE SHIELD | Attending: Emergency Medicine | Admitting: Emergency Medicine

## 2017-06-14 ENCOUNTER — Encounter (HOSPITAL_COMMUNITY): Payer: Self-pay | Admitting: Emergency Medicine

## 2017-06-14 DIAGNOSIS — Z7982 Long term (current) use of aspirin: Secondary | ICD-10-CM | POA: Insufficient documentation

## 2017-06-14 DIAGNOSIS — R569 Unspecified convulsions: Secondary | ICD-10-CM | POA: Diagnosis not present

## 2017-06-14 DIAGNOSIS — Z87891 Personal history of nicotine dependence: Secondary | ICD-10-CM | POA: Diagnosis not present

## 2017-06-14 NOTE — ED Triage Notes (Signed)
Pt brought in by EMS from home  Wife reports they were asleep and she felt him moving around and then heard a thump and he had fallen out of bed When she turned the light on and checked on him he was unresponsive  Upon arrival of fire dept pt was alert but nonverbal  Upon EMS arrival pt was able to answer some questions but was postictal  Pt has hx of seizures but wife states he has not had one since he was younger  Pt unable to say if he took his medication tonight or not  Pt recently just got back from a vacation in [place]

## 2017-06-15 ENCOUNTER — Telehealth: Payer: Self-pay | Admitting: Diagnostic Neuroimaging

## 2017-06-15 ENCOUNTER — Emergency Department (HOSPITAL_COMMUNITY): Payer: BLUE CROSS/BLUE SHIELD

## 2017-06-15 LAB — CBC WITH DIFFERENTIAL/PLATELET
Basophils Absolute: 0 10*3/uL (ref 0.0–0.1)
Basophils Relative: 0 %
Eosinophils Absolute: 0.2 10*3/uL (ref 0.0–0.7)
Eosinophils Relative: 2 %
HEMATOCRIT: 39.9 % (ref 39.0–52.0)
HEMOGLOBIN: 13.7 g/dL (ref 13.0–17.0)
LYMPHS ABS: 2 10*3/uL (ref 0.7–4.0)
LYMPHS PCT: 23 %
MCH: 32.9 pg (ref 26.0–34.0)
MCHC: 34.3 g/dL (ref 30.0–36.0)
MCV: 95.9 fL (ref 78.0–100.0)
Monocytes Absolute: 0.8 10*3/uL (ref 0.1–1.0)
Monocytes Relative: 9 %
NEUTROS ABS: 5.8 10*3/uL (ref 1.7–7.7)
NEUTROS PCT: 66 %
Platelets: 215 10*3/uL (ref 150–400)
RBC: 4.16 MIL/uL — AB (ref 4.22–5.81)
RDW: 12.9 % (ref 11.5–15.5)
WBC: 8.9 10*3/uL (ref 4.0–10.5)

## 2017-06-15 LAB — BASIC METABOLIC PANEL
Anion gap: 8 (ref 5–15)
BUN: 21 mg/dL — ABNORMAL HIGH (ref 6–20)
CHLORIDE: 110 mmol/L (ref 101–111)
CO2: 22 mmol/L (ref 22–32)
Calcium: 9.3 mg/dL (ref 8.9–10.3)
Creatinine, Ser: 0.83 mg/dL (ref 0.61–1.24)
GFR calc Af Amer: >60 mL/min (ref >60)
GFR calc non Af Amer: >60 mL/min (ref >60)
Glucose, Bld: 104 mg/dL — ABNORMAL HIGH (ref 65–99)
POTASSIUM: 3.6 mmol/L (ref 3.5–5.1)
SODIUM: 140 mmol/L (ref 135–145)

## 2017-06-15 NOTE — Telephone Encounter (Addendum)
Spoke to patient - states while traveling this week, he missed several doses of his medication and was sleep deprived.  He had a seizure on 06/14/17 but has since returned to baseline.  Dr. Terrace ArabiaYan has reviewed his chart.  Okay for him to follow up with NP.  Appt scheduled on 06/19/17 with Megan.  Patient was appreciative to be seen so quickly.

## 2017-06-15 NOTE — Telephone Encounter (Signed)
[place] wife [place] has called stating pt was taken to [facility] last night because of seizure he had and was advised for pt to f/u with Dr Marjory LiesPenumalli .  Dr Richrd HumblesPenumalli's 1st available is 4-22, pt is on wait list but would like to be called if he can be seen before.  Please call

## 2017-06-15 NOTE — Discharge Instructions (Signed)
Please be aware you may have another seizure ° °Do not drive until seen by your physician for your condition ° °Do not climb ladders/roofs/trees as a seizure can occur at that height and cause serious harm ° °Do not bathe/swim alone as a seizure can occur and cause serious harm ° °Please followup with your physician or neurologist for further testing and possible treatment ° ° °

## 2017-06-15 NOTE — ED Provider Notes (Signed)
[facility]-EMERGENCY DEPT Provider Note   CSN: [id number] Arrival date & time: 06/14/17  2328     History   Chief Complaint Chief Complaint  Patient presents with  . Seizures    HPI Logan Kelley is a 57 y.o. male.  The history is provided by the patient and the spouse.  Seizures   This is a new problem. The current episode started 1 to 2 hours ago. The problem has been gradually improving. There was 1 seizure. Pertinent negatives include no vomiting and no diarrhea. Focality: Unknown. Possible causes include sleep deprivation. There has been no fever.  Patient with history of seizure disorder presents for concern for seizure.  Per wife she felt him moving around in bed and heard him fall out of bed when she checked on him he appeared to be unresponsive.  EMS arrived noted that he appeared to be "postictal" and he was confused.  He has a history of seizures, takes Tegretol as follows she is aware he has been compliant.  He has not had any recent seizures per wife prior to tonight. [place] did just get back from [place], and so they took a 2-day trip back and there was a lack of sleep. Otherwise he has been at his baseline, no vomiting, no diarrhea, no fevers. [place] has [place], but as far as wife is aware he had no illnesses prior to coming home  Past Medical History:  Diagnosis Date  . Depressive disorder, not elsewhere classified   . 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 Headache(784.0)   . History of traumatic head injury    Age 50  . Liver function abnormality   . Nonspecific abnormal results of other specified function study   . Seizures ([facility])    08/31/16  last sz > 15 yrs  . Unspecified psychosis     There are no active problems to display for this patient.   Past Surgical History:  Procedure Laterality Date  . NASAL SINUS SURGERY  1990   polyps removed       Home  Medications    Prior to Admission medications   Medication Sig Start Date End Date Taking? Authorizing Provider  aspirin EC 81 MG tablet Take by mouth. Reported on 05/27/2015 07/02/13   [provider]  carbamazepine (TEGRETOL) 200 MG tablet Take 2 tablets (400 mg total) by mouth 2 (two) times daily. 08/31/16   Penumalli, Glenford Bayley, MD  cholecalciferol (VITAMIN D) 400 UNITS TABS tablet Take 400 Units by mouth.    [provider]  Glucosamine Sulfate 500 MG TABS Frequency:   Dosage:0.0     Instructions:  Note: 07/25/11   [provider]  Multiple Vitamin (MULTIVITAMIN) capsule Reported on 05/27/2015 11/11/10   [provider]  Omega-3 Fatty Acids (FISH OIL) 1000 MG CAPS Frequency:   Dosage:0.0     Instructions:  Note: 07/18/11   [provider]    Family History Family History  Problem Relation Age of Onset  . Cancer Mother   . Heart disease Mother   . Diabetes Mother   . Stroke Father   . Renal Disease Father     Social History Social History   Tobacco Use  . Smoking status: Former Smoker    Last attempt to quit: 07/10/1977    Years since quitting: 39.9  . Smokeless tobacco: Never Used  Substance Use Topics  . Alcohol use: No  .  Drug use: No     Allergies   Bee venom   Review of Systems Review of Systems  Constitutional: Negative for fever.  Gastrointestinal: Negative for diarrhea and vomiting.  Neurological: Positive for seizures.  All other systems reviewed and are negative.    Physical Exam Updated Vital Signs BP 116/74 (BP Location: Left Arm)   Pulse 69   Temp 97.9 F (36.6 C) (Oral)   Resp 17   SpO2 100% Comment: RA  Physical Exam  CONSTITUTIONAL: Well developed/well nourished HEAD: Normocephalic/atraumatic EYES: EOMI/PERRL ENMT: Mucous membranes moist, no tongue lacerations NECK: supple no meningeal signs SPINE/BACK:entire spine nontender CV: S1/S2 noted, no murmurs/rubs/gallops noted LUNGS: Lungs are clear  to auscultation bilaterally, no apparent distress ABDOMEN: soft, nontender, no rebound or guarding, bowel sounds noted throughout abdomen GU:no cva tenderness NEURO: Pt is awake/alert/appropriate, moves all extremitiesx4.  No facial droop.   EXTREMITIES: pulses normal/equal, full ROM, no deformities, no signs of trauma SKIN: warm, color normal PSYCH: no abnormalities of mood noted, alert and oriented to situation  ED Treatments / Results  Labs (all labs ordered are listed, but only abnormal results are displayed) Labs Reviewed  BASIC METABOLIC PANEL - Abnormal; Notable for the following components:      Result Value   Glucose, Bld 104 (*)    BUN 21 (*)    All other components within normal limits  CBC WITH DIFFERENTIAL/PLATELET - Abnormal; Notable for the following components:   RBC 4.16 (*)    All other components within normal limits    EKG  EKG Interpretation  Date/Time:  Wednesday June 14 2017 23:36:44 EST Ventricular Rate:  72 PR Interval:    QRS Duration: 90 QT Interval:  403 QTC Calculation: 441 R Axis:   75 Text Interpretation:  Sinus rhythm RSR' in V1 or V2, probably normal variant No previous ECGs available Interpretation limited secondary to artifact Confirmed by Zadie Rhine ([id number]) on 06/15/2017 12:34:01 AM       Radiology Ct Head Wo Contrast  Result Date: 06/15/2017 CLINICAL DATA:  Seizure, new, nontraumatic. Remote seizure history. History of traumatic brain injury. EXAM: CT HEAD WITHOUT CONTRAST TECHNIQUE: Contiguous axial images were obtained from the base of the skull through the vertex without intravenous contrast. COMPARISON:  None. FINDINGS: Brain: Minimal inferior right frontal encephalomalacia. No intracranial hemorrhage, mass effect, or midline shift. No hydrocephalus. The basilar cisterns are patent. No evidence of territorial infarct or acute ischemia. No extra-axial or intracranial fluid collection. Vascular: No hyperdense vessel or unexpected  calcification. Skull: No fracture or focal lesion. Sinuses/Orbits: No acute finding. Prior nasal sinus surgery. Frontal sinuses are hypo pneumatized. Other: None. IMPRESSION: 1.  No acute intracranial abnormality. 2. Minimal inferior right frontal encephalomalacia, commonly sequela of prior traumatic brain injury. Electronically Signed   By: Rubye Oaks M.D.   On: 06/15/2017 00:58    Procedures Procedures   Medications Ordered in ED Medications - No data to display   Initial Impression / Assessment and Plan / ED Course  I have reviewed the triage vital signs and the nursing notes.  Pertinent labs & imaging results that were available during my care of the patient were reviewed by me and considered in my medical decision making (see chart for details).    12:09 AM Plan to get CT head due to the fact patient had a seizure in several years, will check labs and EKG. 1:28 AM He is back to baseline, he is awake alert no distress, CT head is  negative He reports feeling sore but no other complaints He now admits that he missed Tegretol dosing while they were traveling to [place] We will restart his medication, and will plan this sleep on a normal schedule He will need to follow-up with neurology Advised no driving, no swimming or bathing alone until seen by neurology Final Clinical Impressions(s) / ED Diagnoses   Final diagnoses:  Seizure [facility](HCC)    ED Discharge Orders    None       Zadie RhineWickline, Adarsh Mundorf, MD 06/15/17 0129

## 2017-06-15 NOTE — ED Notes (Signed)
Patient transported to CT 

## 2017-06-19 ENCOUNTER — Ambulatory Visit: Payer: BLUE CROSS/BLUE SHIELD | Admitting: Adult Health

## 2017-06-19 ENCOUNTER — Encounter: Payer: Self-pay | Admitting: Adult Health

## 2017-06-19 VITALS — BP 134/87 | HR 68 | Wt 189.0 lb

## 2017-06-19 DIAGNOSIS — R569 Unspecified convulsions: Secondary | ICD-10-CM

## 2017-06-19 NOTE — Progress Notes (Signed)
I reviewed note and agree with plan.   Darthula Desa R. Taylin Mans, MD 06/19/2017, 5:48 PM Certified in Neurology, Neurophysiology and Neuroimaging  [facility] [address], [address] [place], [place] [address] [phone]  

## 2017-06-19 NOTE — Patient Instructions (Signed)
Your Plan:  Continue Tegretol  Have PCP send blood work to our office If symptoms do not improve within the next week or so call our office If your symptoms worsen or you develop new symptoms please let us know.   Thank you [facility]. I hope we have been able to provide you high quality care today.  You may receive a patient satisfaction survey over the next few weeks. We would appreciate your feedback and comments so that we may continue to improve ourselves and the health of our patients.
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
## 2017-07-10 DIAGNOSIS — Z23 Encounter for immunization: Secondary | ICD-10-CM

## 2017-07-10 HISTORY — DX: Encounter for immunization: Z23

## 2017-07-23 ENCOUNTER — Other Ambulatory Visit: Payer: Self-pay | Admitting: Family Medicine

## 2017-07-23 DIAGNOSIS — E789 Disorder of lipoprotein metabolism, unspecified: Secondary | ICD-10-CM

## 2017-08-04 HISTORY — PX: COLONOSCOPY: SHX174

## 2017-08-18 ENCOUNTER — Other Ambulatory Visit: Payer: BLUE CROSS/BLUE SHIELD

## 2017-08-24 ENCOUNTER — Ambulatory Visit
Admission: RE | Admit: 2017-08-24 | Discharge: 2017-08-24 | Disposition: A | Discharge status: Home / Self Care | Payer: BLUE CROSS/BLUE SHIELD | Source: Ambulatory Visit | Attending: Family Medicine | Admitting: Family Medicine

## 2017-08-24 DIAGNOSIS — E789 Disorder of lipoprotein metabolism, unspecified: Secondary | ICD-10-CM

## 2017-09-01 ENCOUNTER — Ambulatory Visit: Payer: BLUE CROSS/BLUE SHIELD | Admitting: Diagnostic Neuroimaging

## 2017-09-04 ENCOUNTER — Encounter: Payer: Self-pay | Admitting: *Deleted

## 2017-09-04 ENCOUNTER — Encounter: Payer: Self-pay | Admitting: Adult Health

## 2017-09-05 ENCOUNTER — Other Ambulatory Visit: Payer: Self-pay | Admitting: Diagnostic Neuroimaging

## 2017-09-13 ENCOUNTER — Ambulatory Visit: Payer: BLUE CROSS/BLUE SHIELD | Admitting: Diagnostic Neuroimaging

## 2017-09-13 ENCOUNTER — Encounter: Payer: Self-pay | Admitting: Diagnostic Neuroimaging

## 2017-09-13 VITALS — BP 110/74 | HR 72 | Ht 71.5 in | Wt 178.4 lb

## 2017-09-13 DIAGNOSIS — R569 Unspecified convulsions: Secondary | ICD-10-CM

## 2017-09-13 DIAGNOSIS — G40109 Localization-related (focal) (partial) symptomatic epilepsy and epileptic syndromes with simple partial seizures, not intractable, without status epilepticus: Secondary | ICD-10-CM | POA: Diagnosis not present

## 2017-09-13 MED ORDER — CARBAMAZEPINE 200 MG PO TABS: 400.0000 mg | ORAL_TABLET | Freq: Two times a day (BID) | ORAL | 4 refills | Status: DC
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
## 2017-10-02 ENCOUNTER — Ambulatory Visit: Payer: BLUE CROSS/BLUE SHIELD | Admitting: Diagnostic Neuroimaging

## 2017-11-22 ENCOUNTER — Ambulatory Visit (INDEPENDENT_AMBULATORY_CARE_PROVIDER_SITE_OTHER): Payer: PRIVATE HEALTH INSURANCE | Admitting: Psychology

## 2017-11-22 DIAGNOSIS — F4320 Adjustment disorder, unspecified: Secondary | ICD-10-CM | POA: Diagnosis not present

## 2017-12-11 ENCOUNTER — Ambulatory Visit (INDEPENDENT_AMBULATORY_CARE_PROVIDER_SITE_OTHER): Payer: PRIVATE HEALTH INSURANCE | Admitting: Psychology

## 2017-12-11 DIAGNOSIS — F4320 Adjustment disorder, unspecified: Secondary | ICD-10-CM | POA: Diagnosis not present

## 2018-09-13 ENCOUNTER — Telehealth: Payer: Self-pay | Admitting: *Deleted

## 2018-09-13 NOTE — Telephone Encounter (Signed)
Called patient and advised due to current COVID 19 pandemic, our office is severely reducing in person visits in order to minimize the risk to our patients and healthcare providers. We recommend to convert your appointment to a video visit. We'll take all precautions to reduce any security or privacy concerns. This will be treated like an office visit, and we will file with your insurance. There may be a patient responsible charge related to this service. Pt's email is [email or web address]. Pt understands that the cisco webex software must be downloaded and operational on the device pt plans to use for the visit. He already has Webex on his computer, consented to video visit. Medical chart updated. Patient verbalized understanding, appreciation.

## 2018-09-18 ENCOUNTER — Other Ambulatory Visit: Payer: Self-pay

## 2018-09-18 ENCOUNTER — Encounter: Payer: Self-pay | Admitting: Diagnostic Neuroimaging

## 2018-09-18 ENCOUNTER — Ambulatory Visit (INDEPENDENT_AMBULATORY_CARE_PROVIDER_SITE_OTHER): Payer: PRIVATE HEALTH INSURANCE | Admitting: Diagnostic Neuroimaging

## 2018-09-18 DIAGNOSIS — R569 Unspecified convulsions: Secondary | ICD-10-CM

## 2018-09-18 DIAGNOSIS — G40109 Localization-related (focal) (partial) symptomatic epilepsy and epileptic syndromes with simple partial seizures, not intractable, without status epilepticus: Secondary | ICD-10-CM | POA: Diagnosis not present

## 2018-09-18 MED ORDER — CARBAMAZEPINE 200 MG PO TABS: 400.0000 mg | ORAL_TABLET | Freq: Two times a day (BID) | ORAL | 4 refills | Status: DC

## 2018-09-18 NOTE — Progress Notes (Signed)
     Virtual Visit via Video Note  I connected with Logan Kelley on 09/18/18 at  8:30 AM EDT by a video enabled telemedicine application and verified that I am speaking with the correct person using two identifiers.   I discussed the limitations of evaluation and management by telemedicine and the availability of in person appointments. The patient expressed understanding and agreed to proceed.  History of Present Illness: - no seizures; staying home; no headaches - staying healthy with nutrition and exercise - his wife is completing her cancer treatments and doing well also   Observations/Objective: - awake and alert - no nystagmus; eye movements full - face symm - tongue midline - no dysarthria - no tremor in arms; fine finger movements symm  Assessment and Plan:  58 y.o. male here with post traumatic seizures, doing well on carbamazepine 400mg  twice a day. Last seizures in 2008 and Jan 2019 (missed meds; lack of sleep). Now doing well. - continue carbamazepine ER 400mg  twice a day  Follow Up Instructions:  Return in about 1 year (around 09/18/2019).      I discussed the assessment and treatment plan with the patient. The patient was provided an opportunity to ask questions and all were answered. The patient agreed with the plan and demonstrated an understanding of the instructions.   The patient was advised to call back or seek an in-person evaluation if the symptoms worsen or if the condition fails to improve as anticipated.  I provided 15 minutes of non-face-to-face time during this encounter.  Meds ordered this encounter  Medications  . carbamazepine (TEGRETOL) 200 MG tablet    Sig: Take 2 tablets (400 mg total) by mouth 2 (two) times daily.    Dispense:  360 tablet    Refill:  4     Suanne Marker, MD 09/18/2018, 8:55 AM Certified in Neurology, Neurophysiology and Neuroimaging  [facility] [address], [address] [place], [place] [address]  [phone]
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
## 2018-11-28 ENCOUNTER — Telehealth: Payer: Self-pay | Admitting: Physical Therapy

## 2018-11-28 ENCOUNTER — Other Ambulatory Visit: Payer: Self-pay

## 2018-11-28 ENCOUNTER — Ambulatory Visit: Payer: PRIVATE HEALTH INSURANCE | Attending: Family Medicine | Admitting: Physical Therapy

## 2018-11-28 ENCOUNTER — Encounter: Payer: Self-pay | Admitting: Physical Therapy

## 2018-11-28 DIAGNOSIS — M6281 Muscle weakness (generalized): Secondary | ICD-10-CM

## 2018-11-28 DIAGNOSIS — M21372 Foot drop, left foot: Secondary | ICD-10-CM | POA: Diagnosis present

## 2018-11-28 NOTE — Telephone Encounter (Signed)
Physical Therapist Called Rererring FNP Christa See to discuss his foot weakness and atrophy which is concerning as it has developed over the last 2 weeks and PT feels he needs further work out and diagnostic testing to rule out any nerve damage or CNS pathology. FNP not available but left message to call out office back as soon as they can to discuss this patients plan of care. Elsie Ra, PT, DPT 11/28/18 3:55 PM
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
## 2018-12-04 ENCOUNTER — Other Ambulatory Visit: Payer: Self-pay

## 2018-12-04 ENCOUNTER — Encounter: Payer: Self-pay | Admitting: Physical Therapy

## 2018-12-04 ENCOUNTER — Ambulatory Visit: Payer: No Typology Code available for payment source | Admitting: Physical Therapy

## 2018-12-04 ENCOUNTER — Ambulatory Visit: Payer: PRIVATE HEALTH INSURANCE | Admitting: Physical Therapy

## 2018-12-04 DIAGNOSIS — M6281 Muscle weakness (generalized): Secondary | ICD-10-CM

## 2018-12-04 DIAGNOSIS — M21372 Foot drop, left foot: Secondary | ICD-10-CM
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
## 2018-12-06 ENCOUNTER — Ambulatory Visit: Payer: PRIVATE HEALTH INSURANCE | Admitting: Physical Therapy

## 2018-12-06 ENCOUNTER — Other Ambulatory Visit: Payer: Self-pay

## 2018-12-06 DIAGNOSIS — M6281 Muscle weakness (generalized): Secondary | ICD-10-CM

## 2018-12-06 DIAGNOSIS — M21372 Foot drop, left foot: Secondary | ICD-10-CM
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
## 2019-01-15 ENCOUNTER — Encounter: Payer: Self-pay | Admitting: *Deleted

## 2019-01-16 ENCOUNTER — Other Ambulatory Visit: Payer: Self-pay

## 2019-01-16 ENCOUNTER — Ambulatory Visit: Payer: PRIVATE HEALTH INSURANCE | Admitting: Diagnostic Neuroimaging

## 2019-01-16 ENCOUNTER — Encounter

## 2019-01-16 ENCOUNTER — Encounter: Payer: Self-pay | Admitting: Diagnostic Neuroimaging

## 2019-01-16 VITALS — BP 130/81 | HR 71 | Temp 98.0°F | Ht 71.5 in | Wt 158.0 lb

## 2019-01-16 DIAGNOSIS — R569 Unspecified convulsions: Secondary | ICD-10-CM | POA: Diagnosis not present

## 2019-01-16 DIAGNOSIS — M21372 Foot drop, left foot: Secondary | ICD-10-CM

## 2019-01-16 DIAGNOSIS — G40109 Localization-related (focal) (partial) symptomatic epilepsy and epileptic syndromes with simple partial seizures, not intractable, without status epilepticus: Secondary | ICD-10-CM
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
## 2019-01-16 NOTE — Patient Instructions (Signed)
-   avoid leg crossing and compression at the left knee

## 2019-02-28 ENCOUNTER — Encounter: Payer: PRIVATE HEALTH INSURANCE | Admitting: Diagnostic Neuroimaging

## 2019-03-11 ENCOUNTER — Other Ambulatory Visit: Payer: Self-pay

## 2019-03-11 DIAGNOSIS — Z20822 Contact with and (suspected) exposure to covid-19: Secondary | ICD-10-CM

## 2019-03-12 LAB — NOVEL CORONAVIRUS, NAA: SARS-CoV-2, NAA: NOT DETECTED

## 2019-03-14 ENCOUNTER — Other Ambulatory Visit: Payer: Self-pay

## 2019-03-14 ENCOUNTER — Encounter

## 2019-03-14 ENCOUNTER — Ambulatory Visit (INDEPENDENT_AMBULATORY_CARE_PROVIDER_SITE_OTHER): Payer: PRIVATE HEALTH INSURANCE | Admitting: Diagnostic Neuroimaging

## 2019-03-14 ENCOUNTER — Ambulatory Visit: Payer: PRIVATE HEALTH INSURANCE | Admitting: Diagnostic Neuroimaging

## 2019-03-14 DIAGNOSIS — R200 Anesthesia of skin: Secondary | ICD-10-CM

## 2019-03-14 DIAGNOSIS — Z0289 Encounter for other administrative examinations: Secondary | ICD-10-CM

## 2019-03-14 DIAGNOSIS — M21372 Foot drop, left foot: Secondary | ICD-10-CM

## 2019-03-14 NOTE — Progress Notes (Signed)
Orders Placed This Encounter  Procedures  . CBC with Differential/Platelet  . Comprehensive metabolic panel  . Vitamin B12  . TSH  . Hemoglobin A1c  . ANA,IFA RA Diag Pnl w/rflx Tit/Patn  . Multiple Myeloma Panel (SPEP&IFE w/QIG)   - neuropathy workup labs.    Penni Bombard, MD 95/12/4732, 0:37 PM Certified in Neurology, Neurophysiology and Neuroimaging  [facility] [address] [place], [place] [address] [phone]

## 2019-03-18 NOTE — Procedures (Signed)
[facility]  NCS (NERVE CONDUCTION STUDY) WITH EMG (ELECTROMYOGRAPHY) REPORT   STUDY DATE: 03/14/19 PATIENT NAME: Logan Kelley DOB: [birth year] MRN: [id number]  ORDERING CLINICIAN: Joycelyn Schmid, MD   TECHNOLOGIST: Durenda Age ELECTROMYOGRAPHER: Glenford Bayley. Lynn Recendiz, MD  CLINICAL INFORMATION: 58 year old male with ;eft foot drop.   FINDINGS: NERVE CONDUCTION STUDY: Left peroneal motor sponsors prolonged distal latency (7.3 ms), normal amplitude, slow conduction velocity (26 m/s at the popliteal fossa, 35 m/s at the fibular head).  Right peroneal and bilateral tibial motor responses have normal distal latencies, amplitudes, mildly slow conduction velocities.  Left sural and right superficial peroneal sensory spots of decreased amplitudes.  Right sural sensory response is prolonged peak latency and normal amplitude.  Left superficial peroneal sensory responses normal.  Bilateral tibial and left peroneal F wave latencies are prolonged.  Right peroneal F wave latency is normal.    NEEDLE ELECTROMYOGRAPHY:  Left vastus medialis, tibialis anterior, peroneus longus, gastrocnemius, left lumbar paraspinal muscles normal.  Right tibialis anterior is normal.   IMPRESSION:  This study demonstrates: -Left peroneal motor neuropathy with significant slowing at stimulation at the popliteal fossa (26 m/s).  Needle examination is otherwise unremarkable.  May represent prior compression neuropathy without ongoing denervation. -Mild underlying axonal sensorimotor polyneuropathy.    INTERPRETING PHYSICIAN:  Suanne Marker, MD Certified in Neurology, Neurophysiology and Neuroimaging  [facility] [address] [place], [place] [address] [phone]   [facility]    Nerve / Sites Muscle Latency Ref. Amplitude Ref. Rel Amp Segments Distance Velocity Ref. Area    ms ms mV mV %  cm m/s m/s mVms  L Peroneal - EDB     Ankle EDB 7.3 ?6.5 3.1  ?2.0 100 Ankle - EDB 9   9.2     Fib head EDB 15.8  3.0  95.6 Fib head - Ankle 30 35 ?44 8.6     Pop fossa EDB 19.7  2.6  85.9 Pop fossa - Fib head 10 26 ?44 9.8         Pop fossa - Ankle      R Peroneal - EDB     Ankle EDB 5.6 ?6.5 5.8 ?2.0 100 Ankle - EDB 9   20.2     Fib head EDB 12.7  5.1  86.9 Fib head - Ankle 30 42 ?44 18.8     Pop fossa EDB 15.1  4.6  90.4 Pop fossa - Fib head 10 42 ?44 18.7         Pop fossa - Ankle      L Tibial - AH     Ankle AH 4.2 ?5.8 14.4 ?4.0 100 Ankle - AH 9   36.2     Pop fossa AH 15.8  9.7  67.3 Pop fossa - Ankle 42 36 ?41 31.4  R Tibial - AH     Ankle AH 3.8 ?5.8 13.1 ?4.0 100 Ankle - AH 9   33.9     Pop fossa AH 15.1  9.7  73.9 Pop fossa - Ankle 42 37 ?41 28.2             SNC    Nerve / Sites Rec. Site Peak Lat Ref.  Amp Ref. Segments Distance    ms ms V V  cm  L Sural - Ankle (Calf)     Calf Ankle 4.3 ?4.4 4 ?6 Calf - Ankle 14  R Sural - Ankle (Calf)     Calf  Ankle 4.6 ?4.4 8 ?6 Calf - Ankle 14  L Superficial peroneal - Ankle     Lat leg Ankle 3.6 ?4.4 8 ?6 Lat leg - Ankle 14  R Superficial peroneal - Ankle     Lat leg Ankle 3.5 ?4.4 2 ?6 Lat leg - Ankle 14              F  Wave    Nerve F Lat Ref.   ms ms  L Peroneal - EDB 67.2 ?56.0  L Tibial - AH 61.7 ?56.0  R Peroneal - EDB 54.8 ?56.0  R Tibial - AH 60.8 ?56.0             EMG full       EMG Summary Table    Spontaneous MUAP Recruitment  Muscle IA Fib PSW Fasc Other Amp Dur. Poly Pattern  L. Vastus medialis Normal None None None _______ Normal Normal Normal Normal  L. Peroneus longus Normal None None None _______ Normal Normal Normal Normal  L. Tibialis anterior Normal None None None _______ Normal Normal Normal Normal  L. Gastrocnemius (Medial head) Normal None None None _______ Normal Normal Normal Normal  R. Tibialis anterior Normal None None None _______ Normal Normal Normal Normal  L. Lumbar paraspinals Normal None None None _______ Normal Normal Normal Normal

## 2019-03-19 LAB — MULTIPLE MYELOMA PANEL, SERUM
Albumin SerPl Elph-Mcnc: 4.2 g/dL (ref 2.9–4.4)
Albumin/Glob SerPl: 1.4 (ref 0.7–1.7)
Alpha 1: 0.3 g/dL (ref 0.0–0.4)
Alpha2 Glob SerPl Elph-Mcnc: 0.9 g/dL (ref 0.4–1.0)
B-Globulin SerPl Elph-Mcnc: 0.8 g/dL (ref 0.7–1.3)
Gamma Glob SerPl Elph-Mcnc: 1.1 g/dL (ref 0.4–1.8)
Globulin, Total: 3.1 g/dL (ref 2.2–3.9)
IgA/Immunoglobulin A, Serum: <5 mg/dL — ABNORMAL LOW (ref 90–386)
IgG (Immunoglobin G), Serum: 1224 mg/dL (ref 603–1613)
IgM (Immunoglobulin M), Srm: 27 mg/dL (ref 20–172)

## 2019-03-19 LAB — COMPREHENSIVE METABOLIC PANEL
ALT: 22 IU/L (ref 0–44)
AST: 22 IU/L (ref 0–40)
Albumin/Globulin Ratio: 1.8 (ref 1.2–2.2)
Albumin: 4.7 g/dL (ref 3.8–4.9)
Alkaline Phosphatase: 80 IU/L (ref 39–117)
BUN/Creatinine Ratio: 13 (ref 9–20)
BUN: 9 mg/dL (ref 6–24)
Bilirubin Total: 0.2 mg/dL (ref 0.0–1.2)
CO2: 24 mmol/L (ref 20–29)
Calcium: 9.5 mg/dL (ref 8.7–10.2)
Chloride: 93 mmol/L — ABNORMAL LOW (ref 96–106)
Creatinine, Ser: 0.71 mg/dL — ABNORMAL LOW (ref 0.76–1.27)
GFR calc Af Amer: 119 mL/min/{1.73_m2} (ref >59)
GFR calc non Af Amer: 103 mL/min/{1.73_m2} (ref >59)
Globulin, Total: 2.6 g/dL (ref 1.5–4.5)
Glucose: 95 mg/dL (ref 65–99)
Potassium: 5 mmol/L (ref 3.5–5.2)
Sodium: 130 mmol/L — ABNORMAL LOW (ref 134–144)
Total Protein: 7.3 g/dL (ref 6.0–8.5)

## 2019-03-19 LAB — CBC WITH DIFFERENTIAL/PLATELET
Basophils Absolute: 0.1 10*3/uL (ref 0.0–0.2)
Basos: 1 %
EOS (ABSOLUTE): 0.2 10*3/uL (ref 0.0–0.4)
Eos: 3 %
Hematocrit: 37.3 % — ABNORMAL LOW (ref 37.5–51.0)
Hemoglobin: 13.6 g/dL (ref 13.0–17.7)
Immature Grans (Abs): 0 10*3/uL (ref 0.0–0.1)
Immature Granulocytes: 0 %
Lymphocytes Absolute: 2 10*3/uL (ref 0.7–3.1)
Lymphs: 36 %
MCH: 33.2 pg — ABNORMAL HIGH (ref 26.6–33.0)
MCHC: 36.5 g/dL — ABNORMAL HIGH (ref 31.5–35.7)
MCV: 91 fL (ref 79–97)
Monocytes Absolute: 0.7 10*3/uL (ref 0.1–0.9)
Monocytes: 12 %
Neutrophils Absolute: 2.7 10*3/uL (ref 1.4–7.0)
Neutrophils: 48 %
Platelets: 329 10*3/uL (ref 150–450)
RBC: 4.1 x10E6/uL — ABNORMAL LOW (ref 4.14–5.80)
RDW: 12.2 % (ref 11.6–15.4)
WBC: 5.7 10*3/uL (ref 3.4–10.8)

## 2019-03-19 LAB — ANA,IFA RA DIAG PNL W/RFLX TIT/PATN
ANA Titer 1: NEGATIVE
Cyclic Citrullin Peptide Ab: 3 units (ref 0–19)
Rhuematoid fact SerPl-aCnc: <10 IU/mL (ref 0.0–13.9)

## 2019-03-19 LAB — HEMOGLOBIN A1C
Est. average glucose Bld gHb Est-mCnc: 100 mg/dL
Hgb A1c MFr Bld: 5.1 % (ref 4.8–5.6)

## 2019-03-19 LAB — VITAMIN B12: Vitamin B-12: 1030 pg/mL (ref 232–1245)

## 2019-03-19 LAB — TSH: TSH: 2.04 u[IU]/mL (ref 0.450–4.500)

## 2019-03-21 ENCOUNTER — Telehealth: Payer: Self-pay | Admitting: Diagnostic Neuroimaging

## 2019-03-21 NOTE — Telephone Encounter (Signed)
Pt called wanting to know if his lab results have come in. Please advise. 

## 2019-03-21 NOTE — Telephone Encounter (Signed)
Patient called back and stated that since his NCS, his foot has gotten a lot better since NCS, The next day he could walk without too much of a limp. He stated he has developed shingles and wanted ot know if it is related. I advised him it is not; it is caused by the chicken pox virus in our bodies. Patient verbalized understanding, appreciation.

## 2019-03-21 NOTE — Telephone Encounter (Signed)
LVM informing patient he had unremarkable labs, except a mild low sodium. I Advised this is not related to neuropathy; could be over hydration vs other cause of salt loss). Dr Leta Baptist would recommend to repeat sodium level in 2-4 weeks with PCP. Left number for questions.

## 2019-04-01 ENCOUNTER — Other Ambulatory Visit: Payer: Self-pay

## 2019-04-01 ENCOUNTER — Encounter (HOSPITAL_COMMUNITY): Payer: Self-pay | Admitting: Emergency Medicine

## 2019-04-01 ENCOUNTER — Emergency Department (HOSPITAL_COMMUNITY): Payer: PRIVATE HEALTH INSURANCE

## 2019-04-01 ENCOUNTER — Emergency Department (HOSPITAL_COMMUNITY)
Admission: EM | Admit: 2019-04-01 | Discharge: 2019-04-01 | Disposition: A | Discharge status: Home / Self Care | Payer: PRIVATE HEALTH INSURANCE | Attending: Emergency Medicine | Admitting: Emergency Medicine

## 2019-04-01 DIAGNOSIS — Z79899 Other long term (current) drug therapy: Secondary | ICD-10-CM | POA: Diagnosis not present

## 2019-04-01 DIAGNOSIS — Z7982 Long term (current) use of aspirin: Secondary | ICD-10-CM | POA: Diagnosis not present

## 2019-04-01 DIAGNOSIS — R059 Cough, unspecified: Secondary | ICD-10-CM

## 2019-04-01 DIAGNOSIS — R0602 Shortness of breath: Secondary | ICD-10-CM | POA: Insufficient documentation

## 2019-04-01 DIAGNOSIS — R05 Cough: Secondary | ICD-10-CM | POA: Insufficient documentation

## 2019-04-01 DIAGNOSIS — Z87891 Personal history of nicotine dependence: Secondary | ICD-10-CM | POA: Insufficient documentation

## 2019-04-01 NOTE — ED Provider Notes (Signed)
[facility]-EMERGENCY DEPT Provider Note   CSN: [id number] Arrival date & time: 04/01/19  1032     History   Chief Complaint Chief Complaint  Patient presents with  . Cough    HPI BADR PIEDRA is a 58 y.o. male.     58 y.o male non smoker with a PMH of Seizures presents to the ED with a chief complaint of cough x 1 week. Patient endorses a dry to productive cough for the past week with some green sputum. He has tried some cough drops which has not helped with his symptoms. He also endorses some shortness of breath, states he feel its difficult for him to take a deep breath without coughing. He has also taken some mucinex for the past two days with no improvement. He also bought some delsym last night which has improved  His symptoms. Patient reports getting a flu vaccine on 10/14 but states the symptoms were there prior to the vaccine. He denies any fever, CAD,sore throat, chest pain or leg swelling.   The history is provided by the patient.  Cough Associated symptoms: shortness of breath   Associated symptoms: no chest pain, no fever and no wheezing     Past Medical History:  Diagnosis Date  . Depressive disorder, not elsewhere classified   . Foot drop    Left  . 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 Headache(784.0)   . History of traumatic head injury    Age 58  . Hyperlipidemia   . Liver function abnormality   . Nonspecific abnormal results of other specified function study   . Seizures (HCC)    08/31/16  last sz > 15 yrs  . Sleep apnea   . Tetanus toxoid inoculation 07/10/2017   Tetanus Shot on July 10, 2017 @ [facility] - [address], [place], [place]  . Unspecified psychosis     There are no active problems to display for this patient.   Past Surgical History:  Procedure Laterality Date  . COLONOSCOPY  08/04/2017  . NASAL SINUS SURGERY  1990   polyps removed        Home Medications    Prior to Admission medications   Medication Sig Start Date End Date Taking? Authorizing Provider  aspirin EC 81 MG tablet Take by mouth. Reported on 05/27/2015 07/02/13   [provider]  carbamazepine (TEGRETOL) 200 MG tablet Take 2 tablets (400 mg total) by mouth 2 (two) times daily. 09/18/18   Penumalli, Glenford Bayley, MD  cetirizine (ZYRTEC) 10 MG tablet Take 10 mg by mouth daily. 01/04/19   [provider]  cholecalciferol (VITAMIN D) 400 UNITS TABS tablet Take 400 Units by mouth.    [provider]  Glucosamine Sulfate 500 MG TABS Frequency:   Dosage:0.0     Instructions:  Note: 07/25/11   [provider]  Multiple Vitamin (MULTIVITAMIN) capsule Reported on 05/27/2015 11/11/10   [provider]    Family History Family History  Problem Relation Age of Onset  . Cancer Mother   . Heart disease Mother   . Diabetes Mother   . Stroke Father   . Renal Disease Father     Social History Social History   Tobacco Use  . Smoking status: Former Smoker    Quit date: 07/10/1977    Years since quitting: 41.7  . Smokeless tobacco: Never Used  Substance Use Topics  . Alcohol use:  No  . Drug use: No     Allergies   Bee venom   Review of Systems Review of Systems  Constitutional: Negative for fever.  Respiratory: Positive for cough and shortness of breath. Negative for chest tightness and wheezing.   Cardiovascular: Negative for chest pain, palpitations and leg swelling.     Physical Exam Updated Vital Signs BP (!) 135/101 (BP Location: Left Arm)   Pulse 82   Temp 97.8 F (36.6 C) (Oral)   Resp 16   Ht 5\' 11"  (1.803 m)   Wt 64 kg   SpO2 97%   BMI 19.67 kg/m   Physical Exam Vitals signs and nursing note reviewed.  Constitutional:      Appearance: Normal appearance. He is not ill-appearing.  HENT:     Head: Normocephalic and atraumatic.     Nose: Nose normal. No congestion or rhinorrhea.     Comments: No  sinus tenderness. No oropharynx erythema.     Mouth/Throat:     Mouth: Mucous membranes are moist.     Pharynx: No oropharyngeal exudate or posterior oropharyngeal erythema.  Eyes:     Pupils: Pupils are equal, round, and reactive to light.  Neck:     Musculoskeletal: Normal range of motion and neck supple.  Cardiovascular:     Rate and Rhythm: Normal rate.  Pulmonary:     Effort: Pulmonary effort is normal.     Breath sounds: Rhonchi present.     Comments: Lungs are slightly diminished to auscultation, but difficult to listen due to patient's cough.  Abdominal:     General: Abdomen is flat. There is no distension.     Tenderness: There is no abdominal tenderness.  Skin:    General: Skin is warm and dry.  Neurological:     Mental Status: He is alert and oriented to person, place, and time.      ED Treatments / Results  Labs (all labs ordered are listed, but only abnormal results are displayed) Labs Reviewed - No data to display  EKG None  Radiology Dg Chest Portable 1 View  Result Date: 04/01/2019 CLINICAL DATA:  Cough and shortness of breath EXAM: PORTABLE CHEST 1 VIEW COMPARISON:  10/03/2017 FINDINGS: The heart size and mediastinal contours are within normal limits. Both lungs are clear. The visualized skeletal structures are unremarkable. IMPRESSION: No active disease. Electronically Signed   By: Inez Catalina M.D.   On: 04/01/2019 11:51    Procedures Procedures (including critical care time)  Medications Ordered in ED Medications - No data to display   Initial Impression / Assessment and Plan / ED Course  I have reviewed the triage vital signs and the nursing notes.  Pertinent labs & imaging results that were available during my care of the patient were reviewed by me and considered in my medical decision making (see chart for details).       Patient with no significant past medical history presented to ED with complaints of cough x1 week.  Patient had a flu  shot last Wednesday, reports the symptoms were there prior to the flu shot however he feels like they were somewhat exacerbated.  Has been trying over-the-counter medication with some improvement in symptoms.  He denies any history of smoking, he has had no fevers, chest pain, does endorse some shortness of breath this happens with coughing fits.  He does not have a history of CAD, last echo was done last year with normal results.  There is no swelling  to his legs, with the patient for any heart failure at this time.  Patient arrived in the ED with elevated pressure, suspect this is likely to his dedication, oxygenation is 97% on room air, there is no tachypnea or tachycardia on my exam.  Lungs are somewhat diminished to auscultation, difficult to auscultate due to patient coughing fits.  Will obtain chest x-ray to further evaluate his complaint.  X-ray show no consolidation, pleural effusion, pneumothorax.  Patient is oxygenating between 97% to 100 on room air.  He has had no fevers, he is nontoxic-appearing, vitals are within normal limits, he is currently quarantining at home with his wife who is immunosuppressed.  Low suspicion for any COVID-19 infection although we discussed risks and benefits of testing which patient deferred at this time.  He is to continue with Delsym treatment and over-the-counter measures.  He understands and agrees with management return precautions discussed at length.   Portions of this note were generated with Scientist, clinical (histocompatibility and immunogenetics)Dragon dictation software. Dictation errors may occur despite best attempts at proofreading.  Final Clinical Impressions(s) / ED Diagnoses   Final diagnoses:  Cough    ED Discharge Orders    None       Claude MangesSoto, Sandrina Heaton, PA-C 04/01/19 1233    Terrilee FilesButler, Michael C, MD 04/01/19 1929

## 2019-04-01 NOTE — Discharge Instructions (Addendum)
Your x-ray today was normal.  You may continue taking Delsym to help with your symptoms.  Your oxygen level today was between 97 and 100%, we spoke about purchasing an oxygen reader to check your oxygenation at home.  If you experience any chest pain, shortness of breath, worsening symptoms you may return to the emergency department.  Follow-up with your primary care physician as needed.

## 2019-04-01 NOTE — ED Notes (Signed)
ED Provider at bedside. 

## 2019-04-01 NOTE — ED Notes (Signed)
Pt complaint of ongoing cough and SOB since given flu shot on Wednesday; "I had to sit up to sleep last night." Pt verbalizes chest pain only with cough.

## 2019-04-01 NOTE — ED Notes (Signed)
XR at bedside

## 2019-04-05 ENCOUNTER — Telehealth: Payer: Self-pay | Admitting: Diagnostic Neuroimaging

## 2019-04-05 DIAGNOSIS — R569 Unspecified convulsions: Secondary | ICD-10-CM

## 2019-04-05 DIAGNOSIS — Z79899 Other long term (current) drug therapy: Secondary | ICD-10-CM

## 2019-04-05 NOTE — Telephone Encounter (Signed)
Rob from [facility] called needing to speak to provider or RN about a drug interaction. Please ask for Rob or any pharmacist on duty. Please advise.

## 2019-04-07 ENCOUNTER — Ambulatory Visit (INDEPENDENT_AMBULATORY_CARE_PROVIDER_SITE_OTHER): Payer: PRIVATE HEALTH INSURANCE

## 2019-04-07 ENCOUNTER — Ambulatory Visit
Admission: EM | Admit: 2019-04-07 | Discharge: 2019-04-07 | Disposition: A | Discharge status: Home / Self Care | Payer: PRIVATE HEALTH INSURANCE | Attending: Emergency Medicine | Admitting: Emergency Medicine

## 2019-04-07 ENCOUNTER — Telehealth: Payer: No Typology Code available for payment source

## 2019-04-07 ENCOUNTER — Other Ambulatory Visit: Payer: Self-pay

## 2019-04-07 DIAGNOSIS — R05 Cough: Secondary | ICD-10-CM | POA: Diagnosis not present

## 2019-04-07 DIAGNOSIS — J209 Acute bronchitis, unspecified: Secondary | ICD-10-CM

## 2019-04-07 DIAGNOSIS — Z1159 Encounter for screening for other viral diseases: Secondary | ICD-10-CM

## 2019-04-07 DIAGNOSIS — R059 Cough, unspecified: Secondary | ICD-10-CM

## 2019-04-07 MED ORDER — ALBUTEROL SULFATE HFA 108 (90 BASE) MCG/ACT IN AERS: 2.0000 | INHALATION_SPRAY | RESPIRATORY_TRACT | 0 refills | Status: DC | PRN

## 2019-04-07 MED ORDER — PREDNISONE 20 MG PO TABS: 20.0000 mg | ORAL_TABLET | Freq: Every day | ORAL | 0 refills | Status: AC

## 2019-04-07 NOTE — Discharge Instructions (Signed)
Your COVID test is pending: Is important you quarantine at home until your results are back. °You may take OTC Tylenol for fever and myalgias.  It is important to drink plenty of water throughout the day to stay hydrated. °If you test positive and later develop severe fever, cough, or shortness of breath, it is recommended that you go to the ER for further evaluation. °

## 2019-04-07 NOTE — ED Provider Notes (Signed)
[facility]    CSN: [id number] Arrival date & time: 04/07/19  1129      History   Chief Complaint Chief Complaint  Patient presents with  . Cough    HPI Logan Kelley is a 58 y.o. malen with history of seizures, hyperlipidemia, sleep apnea presenting for 2-week course of persistent productive, nonhemoptic cough.  Patient was evaluated on 10/19 in the ER: Please see ER notes, which were reviewed by me at time of appointment.  Patient had single view portable x-ray done without evidence of consolidation, edema-no Covid testing done-patient encouraged to continue Delsym as needed for cough.  Patient has been compliant with this, though is frustrated that "this is the only thing have been given this whole time ".  Patient states that his cough and shortness of breath is worse at night, when laying down.  Also endorsing costal pain that is worse with cough.  Patient states that his cough has waxed and waned in severity, though not productivity, since discharge.  Patient endorsing former tobacco use in adolescence: Quit greater than 35 years ago.  Patient denies known sick contacts, requesting Covid testing today.  Past Medical History:  Diagnosis Date  . Depressive disorder, not elsewhere classified   . Foot drop    Left  . 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 Headache(784.0)   . History of traumatic head injury    Age 58  . Hyperlipidemia   . Liver function abnormality   . Nonspecific abnormal results of other specified function study   . Seizures (HCC)    08/31/16  last sz > 15 yrs  . Sleep apnea   . Tetanus toxoid inoculation 07/10/2017   Tetanus Shot on July 10, 2017 @ [facility] - [address], [place], [place]  . Unspecified psychosis     There are no active problems to display for this patient.   Past Surgical History:  Procedure Laterality Date  . COLONOSCOPY  08/04/2017  . NASAL SINUS  SURGERY  1990   polyps removed       Home Medications    Prior to Admission medications   Medication Sig Start Date End Date Taking? Authorizing Provider  albuterol (VENTOLIN HFA) 108 (90 Base) MCG/ACT inhaler Inhale 2 puffs into the lungs every 4 (four) hours as needed for wheezing or shortness of breath. 04/07/19   Hall-Potvin, [place], PA-C  aspirin EC 81 MG tablet Take by mouth. Reported on 05/27/2015 07/02/13   [provider]  carbamazepine (TEGRETOL) 200 MG tablet Take 2 tablets (400 mg total) by mouth 2 (two) times daily. 09/18/18   Penumalli, Glenford BayleyVikram R, MD  cetirizine (ZYRTEC) 10 MG tablet Take 10 mg by mouth daily. 01/04/19   [provider]  cholecalciferol (VITAMIN D) 400 UNITS TABS tablet Take 400 Units by mouth.    [provider]  Glucosamine Sulfate 500 MG TABS Frequency:   Dosage:0.0     Instructions:  Note: 07/25/11   [provider]  Multiple Vitamin (MULTIVITAMIN) capsule Reported on 05/27/2015 11/11/10   [provider]  predniSONE (DELTASONE) 20 MG tablet Take 1 tablet (20 mg total) by mouth daily for 7 days. 04/07/19 04/14/19  Hall-Potvin, [place], PA-C    Family History Family History  Problem Relation Age of Onset  . Cancer Mother   . Heart disease Mother   . Diabetes Mother   . Stroke Father   . Renal Disease Father  Social History Social History   Tobacco Use  . Smoking status: Former Smoker    Quit date: 07/10/1977    Years since quitting: 41.7  . Smokeless tobacco: Never Used  Substance Use Topics  . Alcohol use: No  . Drug use: No     Allergies   Bee venom   Review of Systems Review of Systems  Constitutional: Negative for fatigue and fever.  HENT: Negative for congestion, dental problem, ear pain, facial swelling, hearing loss, sinus pain, sore throat, trouble swallowing and voice change.   Eyes: Negative for photophobia, pain and visual disturbance.  Respiratory: Positive for cough and  shortness of breath. Negative for chest tightness and wheezing.   Cardiovascular: Negative for chest pain and palpitations.  Gastrointestinal: Negative for abdominal pain, diarrhea and vomiting.  Musculoskeletal: Negative for arthralgias and myalgias.  Skin: Negative for rash and wound.  Neurological: Negative for dizziness, speech difficulty and headaches.  All other systems reviewed and are negative.    Physical Exam Triage Vital Signs ED Triage Vitals  Enc Vitals Group     BP 04/07/19 1152 127/87     Pulse Rate 04/07/19 1152 79     Resp --      Temp 04/07/19 1152 98 F (36.7 C)     Temp Source 04/07/19 1152 Oral     SpO2 04/07/19 1152 94 %     Weight 04/07/19 1148 141 lb 1.5 oz (64 kg)     Height --      Head Circumference --      Peak Flow --      Pain Score 04/07/19 1147 0     Pain Loc --      Pain Edu? --      Excl. in [place]? --    No data found.  Updated Vital Signs BP 127/87 (BP Location: Left Arm)   Pulse 79   Temp 98 F (36.7 C) (Oral)   Wt 141 lb 1.5 oz (64 kg)   SpO2 94%   BMI 19.68 kg/m   Visual Acuity Right Eye Distance:   Left Eye Distance:   Bilateral Distance:    Right Eye Near:   Left Eye Near:    Bilateral Near:     Physical Exam Constitutional:      General: He is not in acute distress. HENT:     Head: Normocephalic and atraumatic.     Right Ear: Tympanic membrane, ear canal and external ear normal.     Left Ear: Tympanic membrane, ear canal and external ear normal.     Nose: No nasal deformity, congestion or rhinorrhea.     Comments: Turbinates nonedematous bilaterally with pink mucosa    Mouth/Throat:     Mouth: Mucous membranes are moist.     Tongue: Tongue does not deviate from midline.     Pharynx: Oropharynx is clear. Uvula midline.     Comments: No tonsillar hypertrophy or exudate Eyes:     General: No scleral icterus.    Conjunctiva/sclera: Conjunctivae normal.     Pupils: Pupils are equal, round, and reactive to light.   Neck:     Musculoskeletal: Normal range of motion and neck supple. No muscular tenderness.  Cardiovascular:     Rate and Rhythm: Normal rate and regular rhythm.     Heart sounds: No murmur. No gallop.   Pulmonary:     Effort: Pulmonary effort is normal. No respiratory distress.     Breath sounds: Normal breath sounds. No  stridor. No rales.     Comments: End phase expiratory wheezing in left lung fields, none right. Lymphadenopathy:     Cervical: No cervical adenopathy.  Skin:    General: Skin is warm.     Capillary Refill: Capillary refill takes less than 2 seconds.     Coloration: Skin is not jaundiced or pale.     Findings: No bruising.  Neurological:     General: No focal deficit present.     Mental Status: He is alert.      UC Treatments / Results  Labs (all labs ordered are listed, but only abnormal results are displayed) Labs Reviewed  NOVEL CORONAVIRUS, NAA    EKG   Radiology Dg Chest 2 View  Result Date: 04/07/2019 CLINICAL DATA:  Cough and shortness of breath EXAM: CHEST - 2 VIEW COMPARISON:  April 01, 2019 FINDINGS: There is no appreciable edema or consolidation. Heart size and pulmonary vascularity are normal. No adenopathy. There is degenerative change in the thoracic spine. IMPRESSION: No appreciable edema or consolidation. Electronically Signed   By: Bretta Bang III M.D.   On: 04/07/2019 13:22    Procedures Procedures (including critical care time)  Medications Ordered in UC Medications - No data to display  Initial Impression / Assessment and Plan / UC Course  I have reviewed the triage vital signs and the nursing notes.  Pertinent labs & imaging results that were available during my care of the patient were reviewed by me and considered in my medical decision making (see chart for details).     CXR done in office, reviewed by me radiology: No appreciable edema or consolidation, stable from previous on 04/01/2019.  Reviewed findings patient  who verbalized understanding.  Will add albuterol inhaler as needed for shortness of breath and prednisone due to expiratory wheezing, likely bronchitis.  Return precautions discussed, patient verbalized understanding and is agreeable to plan. Final Clinical Impressions(s) / UC Diagnoses   Final diagnoses:  Cough  Acute bronchitis, unspecified organism     Discharge Instructions     Your COVID test is pending: Is important you quarantine at home until your results are back. You may take OTC Tylenol for fever and myalgias.  It is important to drink plenty of water throughout the day to stay hydrated. If you test positive and later develop severe fever, cough, or shortness of breath, it is recommended that you go to the ER for further evaluation.    ED Prescriptions    Medication Sig Dispense Auth. Provider   albuterol (VENTOLIN HFA) 108 (90 Base) MCG/ACT inhaler Inhale 2 puffs into the lungs every 4 (four) hours as needed for wheezing or shortness of breath. 18 g Hall-Potvin, [place], PA-C   predniSONE (DELTASONE) 20 MG tablet Take 1 tablet (20 mg total) by mouth daily for 7 days. 7 tablet Hall-Potvin, [place], PA-C     PDMP not reviewed this encounter.   Hall-Potvin, [place], [place] 04/07/19 1345

## 2019-04-07 NOTE — ED Triage Notes (Addendum)
Pt. States last night he woke up & could not breathe. He went back to sleep and woke back up with trouble breathing again. He has taken 2 bottles of delsym. States his left side is in pain and he is fatigue.

## 2019-04-08 LAB — NOVEL CORONAVIRUS, NAA: SARS-CoV-2, NAA: NOT DETECTED

## 2019-04-08 NOTE — Telephone Encounter (Signed)
Per Dr Leta Baptist, the patient may start sertraline. I called [facility], spoke with Shival and informed him. I advised ihm Dr Leta Baptist has ordered a repeat BMP (sodium follow up). Low sodium also can be caused by carbamazepine over the long term.  Shival verbalized understanding, appreciation. Called patient and informed him. He stated he just got message from [facility] about picking [facility]. I advised him Dr Leta Baptist ordered repeat BMP to check sodium level this week. He stated he got tested for Covid yesterday, so I advised he must get a negative result before coming to office.  I advised him of office lab hours. I advised Dr Leta Baptist stated he may need MRI brain if mood swings continue. He verbalized understanding, appreciation. He did state his drop foot is better.   Marland Kitchen

## 2019-04-08 NOTE — Telephone Encounter (Signed)
Called [facility], spoke with pharmacist, Radene Ou who stated the Rx of Sertraline has contraindication with carbamazepine. He stated he had not noted that flag before, but "the system may have been updated". He stated there is higher incident of side effects from carbamazepine observed when taken with Setraline. I advised him will let MD know and call them back. He verbalized understanding, appreciation.

## 2019-10-30 ENCOUNTER — Other Ambulatory Visit: Payer: Self-pay | Admitting: Diagnostic Neuroimaging

## 2019-10-30 NOTE — Telephone Encounter (Signed)
Refilled x 3 months with note to pharmacy: needs to schedule a follow up.

## 2020-01-27 ENCOUNTER — Other Ambulatory Visit: Payer: Self-pay | Admitting: Diagnostic Neuroimaging

## 2020-02-25 ENCOUNTER — Ambulatory Visit: Payer: PRIVATE HEALTH INSURANCE | Admitting: Diagnostic Neuroimaging

## 2020-02-25 ENCOUNTER — Encounter: Payer: Self-pay | Admitting: Diagnostic Neuroimaging

## 2020-02-25 VITALS — BP 139/84 | HR 60 | Ht 71.5 in | Wt 159.0 lb

## 2020-02-25 DIAGNOSIS — R569 Unspecified convulsions: Secondary | ICD-10-CM | POA: Diagnosis not present

## 2020-02-25 DIAGNOSIS — M21372 Foot drop, left foot: Secondary | ICD-10-CM | POA: Diagnosis not present

## 2020-02-25 MED ORDER — CARBAMAZEPINE 200 MG PO TABS
400.0000 mg | ORAL_TABLET | Freq: Two times a day (BID) | ORAL | 4 refills | Status: DC
Start: 2020-02-25 — End: 2020-11-30
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
## 2020-11-11 ENCOUNTER — Telehealth: Payer: Self-pay | Admitting: Diagnostic Neuroimaging

## 2020-11-11 NOTE — Telephone Encounter (Signed)
Pt's wife(on DPR) is asking for a call to discuss a seizure that pt had on yesterday.  She is aware of the f/u in Sept. She asked pt be scheduled to see Dr Marjory Lies on his very next available, please call wife.

## 2020-11-11 NOTE — Telephone Encounter (Signed)
Agree. No other changes. -VRP
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
## 2020-11-12 ENCOUNTER — Encounter: Payer: Self-pay | Admitting: *Deleted

## 2020-11-25 ENCOUNTER — Ambulatory Visit
Admission: RE | Admit: 2020-11-25 | Discharge: 2020-11-25 | Disposition: A | Discharge status: Home / Self Care | Payer: PRIVATE HEALTH INSURANCE | Source: Ambulatory Visit | Attending: Family Medicine | Admitting: Family Medicine

## 2020-11-25 ENCOUNTER — Other Ambulatory Visit: Payer: Self-pay | Admitting: Family Medicine

## 2020-11-25 DIAGNOSIS — R52 Pain, unspecified: Secondary | ICD-10-CM

## 2020-11-30 ENCOUNTER — Ambulatory Visit: Payer: PRIVATE HEALTH INSURANCE | Admitting: Diagnostic Neuroimaging

## 2020-11-30 ENCOUNTER — Encounter: Payer: Self-pay | Admitting: *Deleted

## 2020-11-30 ENCOUNTER — Encounter: Payer: Self-pay | Admitting: Diagnostic Neuroimaging

## 2020-11-30 VITALS — BP 149/85 | HR 62 | Ht 71.0 in | Wt 166.2 lb

## 2020-11-30 DIAGNOSIS — G40109 Localization-related (focal) (partial) symptomatic epilepsy and epileptic syndromes with simple partial seizures, not intractable, without status epilepticus: Secondary | ICD-10-CM | POA: Diagnosis not present

## 2020-11-30 DIAGNOSIS — R569 Unspecified convulsions: Secondary | ICD-10-CM | POA: Diagnosis not present

## 2020-11-30 MED ORDER — CARBAMAZEPINE 200 MG PO TABS: 400.0000 mg | ORAL_TABLET | Freq: Two times a day (BID) | ORAL | 4 refills | Status: DC

## 2020-11-30 NOTE — Patient Instructions (Addendum)
  SEIZURE DISORDER (last seizure 11/10/20) - continue CBZ 400mg  twice a day (plan for life long therapy) - annual CBC, CMP with medical clinic - shortened driving restriction (3 months; provoked by missed meds x 3 days) - ok to work from home until driving restrictions completed
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
## 2020-12-03 ENCOUNTER — Ambulatory Visit (INDEPENDENT_AMBULATORY_CARE_PROVIDER_SITE_OTHER): Payer: PRIVATE HEALTH INSURANCE | Admitting: Orthopaedic Surgery

## 2020-12-03 ENCOUNTER — Encounter: Payer: Self-pay | Admitting: Orthopaedic Surgery

## 2020-12-03 ENCOUNTER — Other Ambulatory Visit: Payer: Self-pay

## 2020-12-03 VITALS — Ht 71.0 in | Wt 166.0 lb

## 2020-12-03 DIAGNOSIS — M542 Cervicalgia: Secondary | ICD-10-CM | POA: Diagnosis not present
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
## 2020-12-16 ENCOUNTER — Ambulatory Visit: Payer: No Typology Code available for payment source | Admitting: Physical Therapy

## 2020-12-16 ENCOUNTER — Other Ambulatory Visit: Payer: Self-pay

## 2020-12-16 ENCOUNTER — Encounter: Payer: Self-pay | Admitting: Physical Therapy

## 2020-12-16 DIAGNOSIS — M25511 Pain in right shoulder: Secondary | ICD-10-CM

## 2020-12-16 DIAGNOSIS — R293 Abnormal posture: Secondary | ICD-10-CM

## 2020-12-16 DIAGNOSIS — M25512 Pain in left shoulder: Secondary | ICD-10-CM

## 2020-12-16 DIAGNOSIS — M542 Cervicalgia: Secondary | ICD-10-CM

## 2020-12-16 DIAGNOSIS — M6281 Muscle weakness (generalized): Secondary | ICD-10-CM | POA: Diagnosis not present

## 2020-12-16 NOTE — Patient Instructions (Signed)
Access Code: [email or web address] URL: [email or web address] Date: 12/16/2020 Prepared by: 02/16/2021  Exercises Seated Cervical Rotation AROM - 3-5 x daily - 7 x weekly - 1-2 sets - 10 reps - 3-5 sec hold Half Neck Circles - 3-5 x daily - 7 x weekly - 1-2 sets - 10 reps - 3-5 sec hold Seated Scapular Retraction - 3-5 x daily - 7 x weekly - 1-2 sets - 10 reps - 5 sec hold Shoulder External Rotation and Scapular Retraction with Resistance - 3-5 x daily - 7 x weekly - 1-2 sets - 10 reps - 5 sec hold Standing Shoulder Flexion to 90 Degrees with Dumbbells - 3-5 x daily - 7 x weekly - 1-2 sets - 10 reps Shoulder Abduction with Dumbbells - Thumbs Up - 3-5 x daily - 7 x weekly - 1-2 sets - 10 reps

## 2020-12-16 NOTE — Therapy (Signed)
[facility] [address] [place], [place], [address] Phone: [phone]   Fax:  [phone]  Physical Therapy Evaluation  Patient Details  Name: Logan Kelley MRN: [id number] Date of Birth: 1960-11-30 Referring Provider (PT): Valeria Batman, MD   Encounter Date: 12/16/2020   PT End of Session - 12/16/20 1504     Visit Number 1    Number of Visits 6    Date for PT Re-Evaluation 01/27/21    Authorization Type [employer] $40    PT Start Time 1432    PT Stop Time 1504    PT Time Calculation (min) 32 min    Activity Tolerance Patient tolerated treatment well    Behavior During Therapy [facility] for tasks assessed/performed             Past Medical History:  Diagnosis Date   Depressive disorder, not elsewhere classified    Foot drop    Left   Generalized convulsive epilepsy without mention of intractable epilepsy    last sz 06/14/17   Headache(784.0)    History of traumatic head injury    Age 72   Hyperlipidemia    Liver function abnormality    Nonspecific abnormal results of other specified function study    Seizures (HCC)    08/31/16  last sz > 15 yrs, seizure on 11/11/20   Sleep apnea    Tetanus toxoid inoculation 07/10/2017   Tetanus Shot on July 10, 2017 @ [facility] [address], [place], [place]   Unspecified psychosis     Past Surgical History:  Procedure Laterality Date   COLONOSCOPY  08/04/2017   NASAL SINUS SURGERY  1990   polyps removed    There were no vitals filed for this visit.    Subjective Assessment - 12/16/20 1430     Subjective Pt is a 60 y/o male who presents to OPPT for neck pain.  Pt reports hx of seizures since age 42 following sinus surgery.  He reports having a seizure due to missing medication about 3-4 weeks ago, and then he had neck pain following incident.    Pertinent History depression, epilepsy, hx TBI    Limitations House hold activities    Diagnostic tests xray: significant  arthritic changes at C5-6 and C67; Rt shoulder OA    Patient Stated Goals improve pain    Currently in Pain? Yes    Pain Score 4    up to 8/10   Pain Location Neck   and Rt shoulder   Pain Orientation Posterior    Pain Descriptors / Indicators Aching;Dull    Pain Type Acute pain    Pain Onset 1 to 4 weeks ago    Pain Frequency Intermittent    Aggravating Factors  sleep, yardwork    Pain Relieving Factors medication    Effect of Pain on Daily Activities sleep is difficult                [facility] PT Assessment - 12/16/20 1430       Assessment   Medical Diagnosis M54.2 (ICD-10-CM) - Neck pain    Referring Provider (PT) Valeria Batman, MD    Onset Date/Surgical Date --   3-4 weeks ago   Hand Dominance Right    Next MD Visit PRN    Prior Therapy at [facility] many years ago      Precautions   Precautions None      Restrictions   Weight Bearing Restrictions No  Balance Screen   Has the patient fallen in the past 6 months Yes    How many times? 1 - due to seizure    Has the patient had a decrease in activity level because of a fear of falling?  No    Is the patient reluctant to leave their home because of a fear of falling?  No      Home Tourist information centre manager residence    Living Arrangements Spouse/significant other      Prior Function   Level of Independence Independent    Vocation Full time employment    Vocation Requirements seated desk work    Leisure Product/process development scientist, yard work/grow vegetables, walking      Cognition   Overall Cognitive Status History of cognitive impairments - at baseline    Behaviors Restless      Observation/Other Assessments   Focus on Therapeutic Outcomes (FOTO)  62 (predicted 72)      Posture/Postural Control   Posture/Postural Control Postural limitations    Postural Limitations Rounded Shoulders;Forward head      ROM / Strength   AROM / PROM / Strength AROM;Strength      AROM   AROM Assessment Site Cervical     Cervical Flexion 48    Cervical Extension 28    Cervical - Right Side Bend 30   with pain   Cervical - Left Side Bend 25    Cervical - Right Rotation 63   with discomfort   Cervical - Left Rotation 65      Strength   Strength Assessment Site Shoulder;Elbow    Right/Left Shoulder Right;Left    Right Shoulder Flexion 3/5    Right Shoulder ABduction 3/5    Right Shoulder Internal Rotation 5/5    Right Shoulder External Rotation 3+/5    Left Shoulder Flexion 3/5    Left Shoulder ABduction 3/5    Left Shoulder Internal Rotation 5/5    Left Shoulder External Rotation 3/5    Right/Left Elbow Right;Left    Right Elbow Flexion 5/5    Right Elbow Extension 4/5    Left Elbow Flexion 5/5    Left Elbow Extension 4/5                        Objective measurements completed on examination: See above findings.       [facility] Adult PT Treatment/Exercise - 12/16/20 1430       Exercises   Exercises Other Exercises    Other Exercises  see pt instructions - performed 3-4 reps of each exercise with min cues for technique                    PT Education - 12/16/20 1503     Education Details HEP    Person(s) Educated Patient    Methods Explanation;Demonstration;Handout    Comprehension Verbalized understanding;Returned demonstration;Need further instruction              PT Short Term Goals - 12/16/20 1506       PT SHORT TERM GOAL #1   Title independent with initial HEP    Time 3    Period Weeks    Status New    Target Date 01/06/21               PT Long Term Goals - 12/16/20 1506       PT LONG TERM GOAL #1   Title independent  with final HEP    Time 6    Period Weeks    Status New    Target Date 01/27/21      PT LONG TERM GOAL #2   Title report 75% improvement in sleep quality for improved function    Baseline -    Time 6    Period Weeks    Status New    Target Date 01/27/21      PT LONG TERM GOAL #3   Title demonstrate 4/5 bil  shoulder strength for improved function    Baseline -    Time 6    Period Weeks    Status New    Target Date 01/27/21      PT LONG TERM GOAL #4   Title FOTO score improved to 72 for improved function    Time 6    Period Weeks    Status New    Target Date 01/27/21      PT LONG TERM GOAL #5   Title perform cervical AROM without increase in pain for improved mobility    Time 6    Period Weeks    Status New    Target Date 01/27/21                    Plan - 12/16/20 1500     Clinical Impression Statement Pt is a 60 y/o male who presents to OPPT for neck pain and Rt shoulder pain following fall due to seizure about 3-4 weeks ago.  He demonstrates decreased ROM and strength as well as postural abnormalities and will benefit from PT to address deficits listed.    Personal Factors and Comorbidities Comorbidity 3+    Comorbidities depression, epilepsy, hx TBI    Examination-Activity Limitations Sleep;Caring for Others;Carry;Lift;Reach Overhead    Examination-Participation Restrictions Cleaning;Community Activity;Occupation;Meal Prep;Laundry;Yard Work    Conservation officer, historic buildings Evolving/Moderate complexity    Clinical Decision Making Moderate    Rehab Potential Good    PT Frequency 1x / week    PT Duration 6 weeks    PT Treatment/Interventions ADLs/Self Care Home Management;Cryotherapy;Moist Heat;Therapeutic exercise;Therapeutic activities;Functional mobility training;Neuromuscular re-education;Patient/family education;Manual techniques;Taping;Dry needling;Passive range of motion;Spinal Manipulations    PT Next Visit Plan review HEP, continue with shoulder strengthening, cervical stretches PRN, scap strengthening    PT Home Exercise Plan Access Code: [email or web address]    Consulted and Agree with Plan of Care Patient             Patient will benefit from skilled therapeutic intervention in order to improve the following deficits and impairments:  Decreased strength,  Pain, Impaired UE functional use, Decreased range of motion, Impaired flexibility, Postural dysfunction  Visit Diagnosis: Cervicalgia - Plan: PT plan of care cert/re-cert  Acute pain of right shoulder - Plan: PT plan of care cert/re-cert  Acute pain of left shoulder - Plan: PT plan of care cert/re-cert  Muscle weakness (generalized) - Plan: PT plan of care cert/re-cert  Abnormal posture - Plan: PT plan of care cert/re-cert     Problem List Patient Active Problem List   Diagnosis Date Noted   Cervicalgia 12/03/2020      12/05/2020, PT, DPT 12/16/20 3:11 PM    [facility] [address] [place], [place], [place] Phone: [phone]   Fax:  [phone]  Name: Logan Kelley MRN: [name] Date of Birth: 08/02/1960

## 2020-12-22 ENCOUNTER — Ambulatory Visit: Payer: PRIVATE HEALTH INSURANCE | Admitting: Diagnostic Neuroimaging

## 2020-12-22 ENCOUNTER — Encounter: Payer: No Typology Code available for payment source | Admitting: Physical Therapy

## 2020-12-29 ENCOUNTER — Encounter: Payer: No Typology Code available for payment source | Admitting: Physical Therapy

## 2021-01-05 ENCOUNTER — Encounter: Payer: No Typology Code available for payment source | Admitting: Physical Therapy

## 2021-01-12 ENCOUNTER — Other Ambulatory Visit: Payer: Self-pay

## 2021-01-12 ENCOUNTER — Encounter: Payer: Self-pay | Admitting: Physical Therapy

## 2021-01-12 ENCOUNTER — Ambulatory Visit: Payer: No Typology Code available for payment source | Admitting: Physical Therapy

## 2021-01-12 DIAGNOSIS — R293 Abnormal posture: Secondary | ICD-10-CM | POA: Diagnosis not present

## 2021-01-12 DIAGNOSIS — M542 Cervicalgia: Secondary | ICD-10-CM

## 2021-01-12 DIAGNOSIS — M6281 Muscle weakness (generalized): Secondary | ICD-10-CM | POA: Diagnosis not present

## 2021-01-12 DIAGNOSIS — M25511 Pain in right shoulder: Secondary | ICD-10-CM

## 2021-01-12 NOTE — Therapy (Addendum)
[facility] [address] [place], [place], [address] Phone: [phone]   Fax:  [phone]  Physical Therapy Treatment/Discharge Summary  Patient Details  Name: Logan Kelley MRN: [id number] Date of Birth: 1960/12/16 Referring Provider (PT): Garald Balding, MD   Encounter Date: 01/12/2021   PT End of Session - 01/12/21 1526     Visit Number 2    Number of Visits 6    Date for PT Re-Evaluation 01/27/21    Authorization Type [employer] $40    PT Start Time 1515    PT Stop Time 1553    PT Time Calculation (min) 38 min    Activity Tolerance Patient tolerated treatment well    Behavior During Therapy [facility] for tasks assessed/performed             Past Medical History:  Diagnosis Date   Depressive disorder, not elsewhere classified    Foot drop    Left   Generalized convulsive epilepsy without mention of intractable epilepsy    last sz 06/14/17   Headache(784.0)    History of traumatic head injury    Age 60   Hyperlipidemia    Liver function abnormality    Nonspecific abnormal results of other specified function study    Seizures ([place])    08/31/16  last sz > 15 yrs, seizure on 11/11/20   Sleep apnea    Tetanus toxoid inoculation 07/10/2017   Tetanus Shot on July 10, 2017 @ [place], [place], [place]   Unspecified psychosis     Past Surgical History:  Procedure Laterality Date   COLONOSCOPY  08/04/2017   NASAL SINUS SURGERY  1990   polyps removed    There were no vitals filed for this visit.   Subjective Assessment - 01/12/21 1514     Subjective Pt states his neck pain is minimal but has R shoulder pain that is still lingering with guitar playing. Pt states he gets a click in the shoulder with the HEP.    Pertinent History depression, epilepsy, hx TBI    Limitations House hold activities    Diagnostic tests xray: significant arthritic changes at C5-6 and C67; Rt shoulder OA    Patient Stated Goals  improve pain    Currently in Pain? Yes    Pain Score 3     Pain Location Shoulder    Pain Orientation Right    Pain Descriptors / Indicators Aching;Dull    Pain Onset 1 to 4 weeks ago                               [facility] Adult PT Treatment/Exercise - 01/12/21 0001       Posture/Postural Control   Posture/Postural Control Postural limitations    Postural Limitations Rounded Shoulders;Forward head      Exercises   Exercises Other Exercises;Shoulder    Other Exercises  see pt instructions - performed      Shoulder Exercises: Stretch   Other Shoulder Stretches UT stretch 30s 3x; corner pec stretch 30s 3x      Manual Therapy   Manual Therapy Joint mobilization;Soft tissue mobilization    Joint Mobilization R shoulder GHJ inf and post grade III    Soft tissue mobilization R UT, infra, supra             Access Code: [email or web address] URL: [email or web address] Date: 01/12/2021 Prepared by: Daleen Bo  Exercises Shoulder External Rotation  and Scapular Retraction with Resistance - 3-5 x daily - 7 x weekly - 1-2 sets - 10 reps - 5 sec hold Standing Shoulder Flexion to 90 Degrees with Dumbbells - 3-5 x daily - 7 x weekly - 1-2 sets - 10 reps Shoulder Abduction with Dumbbells - Thumbs Up - 3-5 x daily - 7 x weekly - 1-2 sets - 10 reps Seated Upper Trapezius Stretch - 2 x daily - 7 x weekly - 1 sets - 3 reps - 30 hold Single Arm Doorway Pec Stretch at 90 Degrees Abduction - 2 x daily - 7 x weekly - 1 sets - 3 reps - 30 hold Standing Shoulder Posterior Capsule Stretch - 1 x daily - 7 x weekly - 3 sets - 10 reps        PT Education - 01/12/21 1525     Education Details anatomy, exercise progression, DOMS expectations, muscle firing, HEP, POC    Person(s) Educated Patient    Methods Explanation;Demonstration;Tactile cues;Verbal cues;Handout    Comprehension Verbalized understanding;Returned demonstration;Verbal cues required;Tactile cues required               PT Short Term Goals - 12/16/20 1506       PT SHORT TERM GOAL #1   Title independent with initial HEP    Time 3    Period Weeks    Status New    Target Date 01/06/21               PT Long Term Goals - 12/16/20 1506       PT LONG TERM GOAL #1   Title independent with final HEP    Time 6    Period Weeks    Status New    Target Date 01/27/21      PT LONG TERM GOAL #2   Title report 75% improvement in sleep quality for improved function    Baseline -    Time 6    Period Weeks    Status New    Target Date 01/27/21      PT LONG TERM GOAL #3   Title demonstrate 4/5 bil shoulder strength for improved function    Baseline -    Time 6    Period Weeks    Status New    Target Date 01/27/21      PT LONG TERM GOAL #4   Title FOTO score improved to 72 for improved function    Time 6    Period Weeks    Status New    Target Date 01/27/21      PT LONG TERM GOAL #5   Title perform cervical AROM without increase in pain for improved mobility    Time 6    Period Weeks    Status New    Target Date 01/27/21                   Plan - 01/12/21 1516     Clinical Impression Statement Pt demonstrates signficantly improved C/S ROM and tolerance to R shoulder loading at today's session. Pt with minimal neck pain but has increased R shoulder stiffness and discomfort with ADL and recreational activity. Pt had good relief of with manual therapy, self stretching, and progression of shoulder exercise intensity. Pt likely able to D/C at next session if symptoms continue to improve/subside. Pt would benefit from continued skilled therapy in order to reach goals and maximize functional C/S and R shoulder strength and ROM for full return  to PLOF.    Personal Factors and Comorbidities Comorbidity 3+    Comorbidities depression, epilepsy, hx TBI    Examination-Activity Limitations Sleep;Caring for Others;Carry;Lift;Reach Overhead    Examination-Participation Restrictions  Cleaning;Community Activity;Occupation;Meal Prep;Laundry;Yard Work    Merchant navy officer Evolving/Moderate complexity    Rehab Potential Good    PT Frequency 1x / week    PT Duration 6 weeks    PT Treatment/Interventions ADLs/Self Care Home Management;Cryotherapy;Moist Heat;Therapeutic exercise;Therapeutic activities;Functional mobility training;Neuromuscular re-education;Patient/family education;Manual techniques;Taping;Dry needling;Passive range of motion;Spinal Manipulations    PT Next Visit Plan review HEP, continue with shoulder strengthening, cervical stretches PRN, scap strengthening    PT Home Exercise Plan Access Code: [email or web address]    Consulted and Agree with Plan of Care Patient             Patient will benefit from skilled therapeutic intervention in order to improve the following deficits and impairments:  Decreased strength, Pain, Impaired UE functional use, Decreased range of motion, Impaired flexibility, Postural dysfunction  Visit Diagnosis: Cervicalgia  Acute pain of right shoulder  Muscle weakness (generalized)  Abnormal posture     Problem List Patient Active Problem List   Diagnosis Date Noted   Cervicalgia 12/03/2020    Daleen Bo PT, DPT 01/12/21 3:57 PM   [facility] [address] [place], [place], [address] Phone: [phone]   Fax:  [phone]  Name: YOVANY CLOCK MRN: [id number] Date of Birth: 10/05/1960    PHYSICAL THERAPY DISCHARGE SUMMARY  Visits from Start of Care: 2  Current functional level related to goals / functional outcomes: See above   Remaining deficits: unknown   Education / Equipment: HEP   Patient agrees to discharge. Patient goals were not met. Patient is being discharged due to not returning since the last visit.   Laureen Abrahams, PT, DPT 02/18/21 4:10 PM  [facility] [address] [place], [place], [address] Phone:  [phone]   Fax:  [phone]

## 2021-01-12 NOTE — Patient Instructions (Addendum)
Access Code: [email or web address] URL: [email or web address] Date: 01/12/2021 Prepared by: 03/14/2021  Exercises Half Neck Circles - 3-5 x daily - 7 x weekly - 1-2 sets - 10 reps - 3-5 sec hold Shoulder External Rotation and Scapular Retraction with Resistance - 3-5 x daily - 7 x weekly - 1-2 sets - 10 reps - 5 sec hold Standing Shoulder Flexion to 90 Degrees with Dumbbells - 3-5 x daily - 7 x weekly - 1-2 sets - 10 reps Shoulder Abduction with Dumbbells - Thumbs Up - 3-5 x daily - 7 x weekly - 1-2 sets - 10 reps Seated Upper Trapezius Stretch - 2 x daily - 7 x weekly - 1 sets - 3 reps - 30 hold Single Arm Doorway Pec Stretch at 90 Degrees Abduction - 2 x daily - 7 x weekly - 1 sets - 3 reps - 30 hold

## 2021-01-19 ENCOUNTER — Encounter: Payer: No Typology Code available for payment source | Admitting: Physical Therapy

## 2021-01-27 ENCOUNTER — Encounter: Payer: No Typology Code available for payment source | Admitting: Physical Therapy

## 2021-03-01 NOTE — Patient Instructions (Signed)
Below is our plan:  We will continue carbamazepine 400mg  twice daily. Avoid missed doses.   Please make sure you are staying well hydrated. I recommend 50-60 ounces daily. Well balanced diet and regular exercise encouraged. Consistent sleep schedule with 6-8 hours recommended.   Please continue follow up with care team as directed.   Follow up with me in 1 year   You may receive a survey regarding today's visit. I encourage you to leave honest feed back as I do use this information to improve patient care. Thank you for seeing me today!
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
## 2021-03-02 ENCOUNTER — Telehealth (INDEPENDENT_AMBULATORY_CARE_PROVIDER_SITE_OTHER): Payer: No Typology Code available for payment source | Admitting: Family Medicine

## 2021-03-02 ENCOUNTER — Encounter: Payer: Self-pay | Admitting: Family Medicine

## 2021-03-02 DIAGNOSIS — R569 Unspecified convulsions: Secondary | ICD-10-CM

## 2021-11-30 ENCOUNTER — Encounter: Payer: Self-pay | Admitting: Diagnostic Neuroimaging

## 2021-11-30 ENCOUNTER — Ambulatory Visit: Payer: No Typology Code available for payment source | Admitting: Diagnostic Neuroimaging

## 2021-11-30 VITALS — BP 135/85 | HR 59 | Ht 71.0 in | Wt 170.2 lb

## 2021-11-30 DIAGNOSIS — G40109 Localization-related (focal) (partial) symptomatic epilepsy and epileptic syndromes with simple partial seizures, not intractable, without status epilepticus: Secondary | ICD-10-CM | POA: Diagnosis not present

## 2021-11-30 DIAGNOSIS — R569 Unspecified convulsions: Secondary | ICD-10-CM

## 2021-11-30 MED ORDER — CARBAMAZEPINE ER 400 MG PO TB12
400.0000 mg | ORAL_TABLET | Freq: Two times a day (BID) | ORAL | 4 refills | Status: DC
  Filled 2022-09-12: qty 180, 90d supply, fill #0
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
## 2021-12-01 LAB — CBC WITH DIFFERENTIAL/PLATELET
Basophils Absolute: 0 10*3/uL (ref 0.0–0.2)
Basos: 1 %
EOS (ABSOLUTE): 0.1 10*3/uL (ref 0.0–0.4)
Eos: 2 %
Hematocrit: 40.8 % (ref 37.5–51.0)
Hemoglobin: 13.9 g/dL (ref 13.0–17.7)
Immature Grans (Abs): 0 10*3/uL (ref 0.0–0.1)
Immature Granulocytes: 0 %
Lymphocytes Absolute: 1.5 10*3/uL (ref 0.7–3.1)
Lymphs: 24 %
MCH: 32.2 pg (ref 26.6–33.0)
MCHC: 34.1 g/dL (ref 31.5–35.7)
MCV: 94 fL (ref 79–97)
Monocytes Absolute: 0.7 10*3/uL (ref 0.1–0.9)
Monocytes: 12 %
Neutrophils Absolute: 4 10*3/uL (ref 1.4–7.0)
Neutrophils: 61 %
Platelets: 259 10*3/uL (ref 150–450)
RBC: 4.32 x10E6/uL (ref 4.14–5.80)
RDW: 13.5 % (ref 11.6–15.4)
WBC: 6.3 10*3/uL (ref 3.4–10.8)

## 2021-12-01 LAB — COMPREHENSIVE METABOLIC PANEL
ALT: 24 IU/L (ref 0–44)
AST: 26 IU/L (ref 0–40)
Albumin/Globulin Ratio: 1.9 (ref 1.2–2.2)
Albumin: 4.7 g/dL (ref 3.8–4.8)
Alkaline Phosphatase: 95 IU/L (ref 44–121)
BUN/Creatinine Ratio: 19 (ref 10–24)
BUN: 11 mg/dL (ref 8–27)
Bilirubin Total: 0.5 mg/dL (ref 0.0–1.2)
CO2: 27 mmol/L (ref 20–29)
Calcium: 9.4 mg/dL (ref 8.6–10.2)
Chloride: 94 mmol/L — ABNORMAL LOW (ref 96–106)
Creatinine, Ser: 0.59 mg/dL — ABNORMAL LOW (ref 0.76–1.27)
Globulin, Total: 2.5 g/dL (ref 1.5–4.5)
Glucose: 89 mg/dL (ref 70–99)
Potassium: 5.7 mmol/L — ABNORMAL HIGH (ref 3.5–5.2)
Sodium: 131 mmol/L — ABNORMAL LOW (ref 134–144)
Total Protein: 7.2 g/dL (ref 6.0–8.5)
eGFR: 110 mL/min/{1.73_m2} (ref >59)

## 2021-12-03 ENCOUNTER — Telehealth: Payer: Self-pay

## 2021-12-03 NOTE — Telephone Encounter (Signed)
Mychart message sent to the pt.

## 2021-12-06 ENCOUNTER — Other Ambulatory Visit: Payer: Self-pay | Admitting: Diagnostic Neuroimaging

## 2021-12-17 ENCOUNTER — Other Ambulatory Visit (INDEPENDENT_AMBULATORY_CARE_PROVIDER_SITE_OTHER): Payer: Self-pay

## 2021-12-17 DIAGNOSIS — Z0289 Encounter for other administrative examinations: Secondary | ICD-10-CM

## 2021-12-20 ENCOUNTER — Other Ambulatory Visit: Payer: Self-pay

## 2021-12-20 ENCOUNTER — Other Ambulatory Visit (INDEPENDENT_AMBULATORY_CARE_PROVIDER_SITE_OTHER): Payer: Self-pay

## 2021-12-20 DIAGNOSIS — R569 Unspecified convulsions: Secondary | ICD-10-CM

## 2021-12-20 DIAGNOSIS — Z0289 Encounter for other administrative examinations: Secondary | ICD-10-CM

## 2021-12-20 DIAGNOSIS — G40109 Localization-related (focal) (partial) symptomatic epilepsy and epileptic syndromes with simple partial seizures, not intractable, without status epilepticus: Secondary | ICD-10-CM

## 2021-12-21 LAB — BASIC METABOLIC PANEL
BUN/Creatinine Ratio: 18 (ref 10–24)
BUN: 13 mg/dL (ref 8–27)
CO2: 22 mmol/L (ref 20–29)
Calcium: 9.2 mg/dL (ref 8.6–10.2)
Chloride: 91 mmol/L — ABNORMAL LOW (ref 96–106)
Creatinine, Ser: 0.73 mg/dL — ABNORMAL LOW (ref 0.76–1.27)
Glucose: 82 mg/dL (ref 70–99)
Potassium: 4.4 mmol/L (ref 3.5–5.2)
Sodium: 129 mmol/L — ABNORMAL LOW (ref 134–144)
eGFR: 103 mL/min/{1.73_m2} (ref >59)

## 2022-01-31 ENCOUNTER — Telehealth: Payer: Self-pay

## 2022-01-31 NOTE — Telephone Encounter (Signed)
Called pt to relay lab results. He understood and thanked me for the call.

## 2022-01-31 NOTE — Telephone Encounter (Signed)
-----   Message from Suanne Marker, MD sent at 01/31/2022 10:49 AM EDT ----- Sodium still low. May need to repeat sodium in next 3 months, then may have to reconsider carbamazepine at next visit. Overall sodium stable from last 2 years. -VRP

## 2022-06-21 ENCOUNTER — Other Ambulatory Visit (HOSPITAL_COMMUNITY): Payer: Self-pay

## 2022-06-22 ENCOUNTER — Other Ambulatory Visit (HOSPITAL_COMMUNITY): Payer: Self-pay

## 2022-06-26 ENCOUNTER — Telehealth: Payer: No Typology Code available for payment source | Admitting: Physician Assistant

## 2022-06-26 DIAGNOSIS — U071 COVID-19: Secondary | ICD-10-CM

## 2022-06-26 MED ORDER — ONDANSETRON HCL 4 MG PO TABS: 4.0000 mg | ORAL_TABLET | Freq: Three times a day (TID) | ORAL | 0 refills | Status: DC | PRN

## 2022-06-26 MED ORDER — NIRMATRELVIR/RITONAVIR (PAXLOVID)TABLET: 3.0000 | ORAL_TABLET | Freq: Two times a day (BID) | ORAL | 0 refills | Status: AC

## 2022-06-26 MED ORDER — NIRMATRELVIR/RITONAVIR (PAXLOVID)TABLET: 3.0000 | ORAL_TABLET | Freq: Two times a day (BID) | ORAL | 0 refills | Status: DC

## 2022-06-26 NOTE — Progress Notes (Signed)
Virtual Visit Consent   FRITZ CAUTHON, you are scheduled for a virtual visit with a [facility] provider today. Just as with appointments in the office, your consent must be obtained to participate. Your consent will be active for this visit and any virtual visit you may have with one of our providers in the next 365 days. If you have a MyChart account, a copy of this consent can be sent to you electronically.  As this is a virtual visit, video technology does not allow for your provider to perform a traditional examination. This may limit your provider's ability to fully assess your condition. If your provider identifies any concerns that need to be evaluated in person or the need to arrange testing (such as labs, EKG, etc.), we will make arrangements to do so. Although advances in technology are sophisticated, we cannot ensure that it will always work on either your end or our end. If the connection with a video visit is poor, the visit may have to be switched to a telephone visit. With either a video or telephone visit, we are not always able to ensure that we have a secure connection.  By engaging in this virtual visit, you consent to the provision of healthcare and authorize for your insurance to be billed (if applicable) for the services provided during this visit. Depending on your insurance coverage, you may receive a charge related to this service.  I need to obtain your verbal consent now. Are you willing to proceed with your visit today? Logan Kelley has provided verbal consent on 06/26/2022 for a virtual visit (video or telephone). Mar Daring, PA-C  Date: 06/26/2022 7:08 PM  Virtual Visit via Video Note   I, Mar Daring, connected with  Logan Kelley  ([id number], 25-May-1961) on 06/26/22 at  6:45 PM EST by a video-enabled telemedicine application and verified that I am speaking with the correct person using two identifiers.  Location: Patient: Virtual Visit Location  Patient: Home Provider: Virtual Visit Location Provider: Home Office   I discussed the limitations of evaluation and management by telemedicine and the availability of in person appointments. The patient expressed understanding and agreed to proceed.    History of Present Illness: Logan Kelley is a 62 y.o. who identifies as a male who was assigned male at birth, and is being seen today for Covid 19.  HPI: URI  This is a new problem. The current episode started today (Symptoms started this morning; tested positive on at home test today for Covid 19; was exposed on Thursday last week). The problem has been gradually worsening. There has been no fever. Associated symptoms include congestion, coughing, headaches, nausea, rhinorrhea, sinus pain and a sore throat. Pertinent negatives include no diarrhea, ear pain, plugged ear sensation or vomiting. Associated symptoms comments: Body aches. He has tried acetaminophen for the symptoms. The treatment provided no relief.    Has influenza A 2 weeks ago. Completed prednisone taper.   Problems:  Patient Active Problem List   Diagnosis Date Noted   Cervicalgia 12/03/2020    Allergies:  Allergies  Allergen Reactions   Bee Venom Swelling    SOB   Medications:  Current Outpatient Medications:    nirmatrelvir/ritonavir (PAXLOVID) 20 x 150 MG & 10 x 100MG  TABS, Take 3 tablets by mouth 2 (two) times daily for 5 days. (Take nirmatrelvir 150 mg two tablets twice daily for 5 days and ritonavir 100 mg one tablet twice daily for 5 days)  Patient GFR is 103, Disp: 30 tablet, Rfl: 0   ondansetron (ZOFRAN) 4 MG tablet, Take 1 tablet (4 mg total) by mouth every 8 (eight) hours as needed for nausea or vomiting., Disp: 20 tablet, Rfl: 0   aspirin EC 81 MG tablet, Take by mouth. Reported on 05/27/2015, Disp: , Rfl:    carbamazepine (TEGRETOL XR) 400 MG 12 hr tablet, Take 1 tablet (400 mg total) by mouth 2 (two) times daily., Disp: 180 tablet, Rfl: 4    cholecalciferol (VITAMIN D) 400 UNITS TABS tablet, Take 400 Units by mouth., Disp: , Rfl:    Multiple Vitamin (MULTIVITAMIN) capsule, Reported on 05/27/2015, Disp: , Rfl:    sertraline (ZOLOFT) 50 MG tablet, Take 50 mg by mouth daily., Disp: , Rfl:    Turmeric (QC TUMERIC COMPLEX PO), Take by mouth daily. Dose unknown, Disp: , Rfl:   Observations/Objective: Patient is well-developed, well-nourished in no acute distress.  Resting comfortably at home.  Head is normocephalic, atraumatic.  No labored breathing.  Speech is clear and coherent with logical content.  Patient is alert and oriented at baseline.    Assessment and Plan: 1. COVID-19 - nirmatrelvir/ritonavir (PAXLOVID) 20 x 150 MG & 10 x 100MG  TABS; Take 3 tablets by mouth 2 (two) times daily for 5 days. (Take nirmatrelvir 150 mg two tablets twice daily for 5 days and ritonavir 100 mg one tablet twice daily for 5 days) Patient GFR is 103  Dispense: 30 tablet; Refill: 0 - ondansetron (ZOFRAN) 4 MG tablet; Take 1 tablet (4 mg total) by mouth every 8 (eight) hours as needed for nausea or vomiting.  Dispense: 20 tablet; Refill: 0 - MyChart COVID-19 home monitoring program; Future  - Continue OTC symptomatic management of choice - Will send OTC vitamins and supplement information through AVS - Paxlovid prescribed - Patient enrolled in MyChart symptom monitoring - Push fluids - Rest as needed - Discussed return precautions and when to seek in-person evaluation, sent via AVS as well   Follow Up Instructions: I discussed the assessment and treatment plan with the patient. The patient was provided an opportunity to ask questions and all were answered. The patient agreed with the plan and demonstrated an understanding of the instructions.  A copy of instructions were sent to the patient via MyChart unless otherwise noted below.    The patient was advised to call back or seek an in-person evaluation if the symptoms worsen or if the condition  fails to improve as anticipated.  Time:  I spent 18 minutes with the patient via telehealth technology discussing the above problems/concerns.    Mar Daring, PA-C

## 2022-06-26 NOTE — Progress Notes (Signed)
Patient just needed meds changed to different pharmacy. Mark no charge.

## 2022-06-26 NOTE — Addendum Note (Signed)
Addended by: Mar Daring on: 06/26/2022 07:41 PM   Modules accepted: Orders

## 2022-06-26 NOTE — Patient Instructions (Signed)
Logan Kelley, thank you for joining Mar Daring, PA-C for today's virtual visit.  While this provider is not your primary care provider (PCP), if your PCP is located in our provider database this encounter information will be shared with them immediately following your visit.   [facility] account gives you access to today's visit and all your visits, tests, and labs performed at [facility] " click here if you don't have a [facility] account or go to [email or web address]  Consent: (Patient) Logan Kelley provided verbal consent for this virtual visit at the beginning of the encounter.  Current Medications:  Current Outpatient Medications:    nirmatrelvir/ritonavir (PAXLOVID) 20 x 150 MG & 10 x 100MG  TABS, Take 3 tablets by mouth 2 (two) times daily for 5 days. (Take nirmatrelvir 150 mg two tablets twice daily for 5 days and ritonavir 100 mg one tablet twice daily for 5 days) Patient GFR is 103, Disp: 30 tablet, Rfl: 0   ondansetron (ZOFRAN) 4 MG tablet, Take 1 tablet (4 mg total) by mouth every 8 (eight) hours as needed for nausea or vomiting., Disp: 20 tablet, Rfl: 0   aspirin EC 81 MG tablet, Take by mouth. Reported on 05/27/2015, Disp: , Rfl:    carbamazepine (TEGRETOL XR) 400 MG 12 hr tablet, Take 1 tablet (400 mg total) by mouth 2 (two) times daily., Disp: 180 tablet, Rfl: 4   cholecalciferol (VITAMIN D) 400 UNITS TABS tablet, Take 400 Units by mouth., Disp: , Rfl:    Multiple Vitamin (MULTIVITAMIN) capsule, Reported on 05/27/2015, Disp: , Rfl:    sertraline (ZOLOFT) 50 MG tablet, Take 50 mg by mouth daily., Disp: , Rfl:    Turmeric (QC TUMERIC COMPLEX PO), Take by mouth daily. Dose unknown, Disp: , Rfl:    Medications ordered in this encounter:  Meds ordered this encounter  Medications   nirmatrelvir/ritonavir (PAXLOVID) 20 x 150 MG & 10 x 100MG  TABS    Sig: Take 3 tablets by mouth 2 (two) times daily for 5 days. (Take nirmatrelvir 150  mg two tablets twice daily for 5 days and ritonavir 100 mg one tablet twice daily for 5 days) Patient GFR is 103    Dispense:  30 tablet    Refill:  0    Order Specific Question:   Supervising Provider    Answer:   Chase Picket [[id number]]   ondansetron (ZOFRAN) 4 MG tablet    Sig: Take 1 tablet (4 mg total) by mouth every 8 (eight) hours as needed for nausea or vomiting.    Dispense:  20 tablet    Refill:  0    Order Specific Question:   Supervising Provider    Answer:   Chase Picket [id number]     *If you need refills on other medications prior to your next appointment, please contact your pharmacy*  Follow-Up: Call back or seek an in-person evaluation if the symptoms worsen or if the condition fails to improve as anticipated.  [place] [phone]  Other Instructions  COVID-19 COVID-19, or coronavirus disease 2019, is an infection that is caused by a new (novel) coronavirus called SARS-CoV-2. COVID-19 can cause many symptoms. In some people, the virus may not cause any symptoms. In others, it may cause mild or severe symptoms. Some people with severe infection develop severe disease. What are the causes? This illness is caused by a virus. The virus may be in the air as tiny specks of  fluid (aerosols) or droplets, or it may be on surfaces. You may catch the virus by: Breathing in droplets from an infected person. Droplets can be spread by a person breathing, speaking, singing, coughing, or sneezing. Touching something, like a table or a doorknob, that has virus on it (is contaminated) and then touching your mouth, nose, or eyes. What increases the risk? Risk for infection: You are more likely to get infected with the COVID-19 virus if: You are within 6 ft (1.8 m) of a person with COVID-19 for 15 minutes or longer. You are providing care for a person who is infected with COVID-19. You are in close personal contact with other people. Close personal contact  includes hugging, kissing, or sharing eating or drinking utensils. Risk for serious illness caused by COVID-19: You are more likely to get seriously ill from the COVID-19 virus if: You have cancer. You have a long-term (chronic) disease, such as: Chronic lung disease. This includes pulmonary embolism, chronic obstructive pulmonary disease, and cystic fibrosis. Long-term disease that lowers your body's ability to fight infection (immunocompromise). Serious cardiac conditions, such as heart failure, coronary artery disease, or cardiomyopathy. Diabetes. Chronic kidney disease. Liver diseases. These include cirrhosis, nonalcoholic fatty liver disease, alcoholic liver disease, or autoimmune hepatitis. You have obesity. You are pregnant or were recently pregnant. You have sickle cell disease. What are the signs or symptoms? Symptoms of this condition can range from mild to severe. Symptoms may appear any time from 2 to 14 days after being exposed to the virus. They include: Fever or chills. Shortness of breath or trouble breathing. Feeling tired or very tired. Headaches, body aches, or muscle aches. Runny or stuffy nose, sneezing, coughing, or sore throat. New loss of taste or smell. This is rare. Some people may also have stomach problems, such as nausea, vomiting, or diarrhea. Other people may not have any symptoms of COVID-19. How is this diagnosed? This condition may be diagnosed by testing samples to check for the COVID-19 virus. The most common tests are the PCR test and the antigen test. Tests may be done in the lab or at home. They include: Using a swab to take a sample of fluid from the back of your nose and throat (nasopharyngeal fluid), from your nose, or from your throat. Testing a sample of saliva from your mouth. Testing a sample of coughed-up mucus from your lungs (sputum). How is this treated? Treatment for COVID-19 infection depends on the severity of the condition. Mild  symptoms can be managed at home with rest, fluids, and over-the-counter medicines. Serious symptoms may be treated in a hospital intensive care unit (ICU). Treatment in the ICU may include: Supplemental oxygen. Extra oxygen is given through a tube in the nose, a face mask, or a hood. Medicines. These may include: Antivirals, such as monoclonal antibodies. These help your body fight off certain viruses that can cause disease. Anti-inflammatories, such as corticosteroids. These reduce inflammation and suppress the immune system. Antithrombotics. These prevent or treat blood clots, if they develop. Convalescent plasma. This helps boost your immune system, if you have an underlying immunosuppressive condition or are getting immunosuppressive treatments. Prone positioning. This means you will lie on your stomach. This helps oxygen to get into your lungs. Infection control measures. If you are at risk for more serious illness caused by COVID-19, your health care provider may prescribe two long-acting monoclonal antibodies, given together every 6 months. How is this prevented? To protect yourself: Use preventive medicine (pre-exposure prophylaxis). You  may get pre-exposure prophylaxis if you have moderate or severe immunocompromise. Get vaccinated. Anyone 63 months old or older who meets guidelines can get a COVID-19 vaccine or vaccine series. This includes people who are pregnant or making breast milk (lactating). Get an added dose of COVID-19 vaccine after your first vaccine or vaccine series if you have moderate to severe immunocompromise. This applies if you have had a solid organ transplant or have been diagnosed with an immunocompromising condition. You should get the added dose 4 weeks after you got the first COVID-19 vaccine or vaccine series. If you get an mRNA vaccine, you will need a 3-dose primary series. If you get the J&J/Janssen vaccine, you will need a 2-dose primary series, with the second  dose being an mRNA vaccine. Talk to your health care provider about getting experimental monoclonal antibodies. This treatment is approved under emergency use authorization to prevent severe illness before or after being exposed to the COVID-19 virus. You may be given monoclonal antibodies if: You have moderate or severe immunocompromise. This includes treatments that lower your immune response. People with immunocompromise may not develop protection against COVID-19 when they are vaccinated. You cannot be vaccinated. You may not get a vaccine if you have a severe allergic reaction to the vaccine or its components. You are not fully vaccinated. You are in a facility where COVID-19 is present and: Are in close contact with a person who is infected with the COVID-19 virus. Are at high risk of being exposed to the COVID-19 virus. You are at risk of illness from new variants of the COVID-19 virus. To protect others: If you have symptoms of COVID-19, take steps to prevent the virus from spreading to others. Stay home. Leave your house only to get medical care. Do not use public transit, if possible. Do not travel while you are sick. Wash your hands often with soap and water for at least 20 seconds. If soap and water are not available, use alcohol-based hand sanitizer. Make sure that all people in your household wash their hands well and often. Cough or sneeze into a tissue or your sleeve or elbow. Do not cough or sneeze into your hand or into the air. Where to find more information Centers for Disease Control and Prevention: CharmCourses.be World Health Organization: https://www.castaneda.info/ Get help right away if: You have trouble breathing. You have pain or pressure in your chest. You are confused. You have bluish lips and fingernails. You have trouble waking from sleep. You have symptoms that get worse. These symptoms may be an emergency. Get help right away. Call  911. Do not wait to see if the symptoms will go away. Do not drive yourself to the hospital. Summary COVID-19 is an infection that is caused by a new coronavirus. Sometimes, there are no symptoms. Other times, symptoms range from mild to severe. Some people with a severe COVID-19 infection develop severe disease. The virus that causes COVID-19 can spread from person to person through droplets or aerosols from breathing, speaking, singing, coughing, or sneezing. Mild symptoms of COVID-19 can be managed at home with rest, fluids, and over-the-counter medicines. This information is not intended to replace advice given to you by your health care provider. Make sure you discuss any questions you have with your health care provider. Document Revised: 05/18/2021 Document Reviewed: 05/20/2021 Elsevier Patient Education  [place].    If you have been instructed to have an in-person evaluation today at a local Urgent Care facility, please use  the link below. It will take you to a list of all of our available [facility], including address, phone number and hours of operation. Please do not delay care.  [facility]  If you or a family member do not have a primary care provider, use the link below to schedule a visit and establish care. When you choose a [facility] primary care physician or advanced practice provider, you gain a long-term partner in health. Find a Primary Care Provider  Learn more about [facility]'s in-office and virtual care options: [facility]

## 2022-06-27 ENCOUNTER — Telehealth: Payer: Self-pay

## 2022-06-27 NOTE — Telephone Encounter (Signed)
Called patient in regards to Logan Kelley questions completed in my chart for worse symptoms of appetite. No answer, left VM to call community line.

## 2022-07-04 ENCOUNTER — Telehealth: Payer: Self-pay

## 2022-07-04 NOTE — Telephone Encounter (Signed)
Called patient in regard to [place] on my chart. Left VM to call community line with concerns or questions.

## 2022-07-08 ENCOUNTER — Other Ambulatory Visit (HOSPITAL_COMMUNITY): Payer: Self-pay

## 2022-07-09 ENCOUNTER — Other Ambulatory Visit (HOSPITAL_COMMUNITY): Payer: Self-pay

## 2022-07-09 MED ORDER — CETIRIZINE HCL 10 MG PO TABS
10.0000 mg | ORAL_TABLET | Freq: Every day | ORAL | 2 refills | Status: DC | PRN
  Filled 2022-07-09: qty 90, 90d supply, fill #0
  Filled 2022-12-06: qty 90, 90d supply, fill #1
  Filled 2023-02-27: qty 60, 60d supply, fill #2

## 2022-07-30 ENCOUNTER — Other Ambulatory Visit (HOSPITAL_COMMUNITY): Payer: Self-pay

## 2022-08-01 ENCOUNTER — Other Ambulatory Visit (HOSPITAL_COMMUNITY): Payer: Self-pay

## 2022-08-01 MED ORDER — SERTRALINE HCL 50 MG PO TABS
50.0000 mg | ORAL_TABLET | Freq: Every day | ORAL | 0 refills | Status: DC
  Filled 2022-08-01: qty 90, 90d supply, fill #0

## 2022-09-07 ENCOUNTER — Telehealth: Payer: Self-pay

## 2022-09-07 NOTE — Telephone Encounter (Signed)
Received a Clearance forms from [facility]. Will place in Dr. Leta Baptist in basket for review.

## 2022-09-12 ENCOUNTER — Other Ambulatory Visit (HOSPITAL_COMMUNITY): Payer: Self-pay

## 2022-09-12 MED ORDER — PEG 3350-KCL-NA BICARB-NACL 420 G PO SOLR
ORAL | 0 refills | Status: DC
  Filled 2022-09-12: qty 4000, 1d supply, fill #0

## 2022-09-13 ENCOUNTER — Other Ambulatory Visit (HOSPITAL_COMMUNITY): Payer: Self-pay

## 2022-09-14 ENCOUNTER — Other Ambulatory Visit (HOSPITAL_COMMUNITY): Payer: Self-pay

## 2022-10-24 ENCOUNTER — Other Ambulatory Visit (HOSPITAL_COMMUNITY): Payer: Self-pay

## 2022-10-26 ENCOUNTER — Other Ambulatory Visit (HOSPITAL_COMMUNITY): Payer: Self-pay

## 2022-10-26 ENCOUNTER — Other Ambulatory Visit: Payer: Self-pay

## 2022-10-26 MED ORDER — SERTRALINE HCL 50 MG PO TABS
50.0000 mg | ORAL_TABLET | Freq: Every day | ORAL | 1 refills | Status: DC
  Filled 2022-10-26: qty 90, 90d supply, fill #0
  Filled 2022-12-06 – 2023-01-24 (×2): qty 90, 90d supply, fill #1

## 2022-10-28 ENCOUNTER — Other Ambulatory Visit (HOSPITAL_COMMUNITY): Payer: Self-pay

## 2022-12-05 NOTE — Patient Instructions (Signed)
Below is our plan:  We will continue carbamazepine XR 400mg  twice daily. We will update labs, today.   Please make sure you are consistent with timing of seizure medication. I recommend annual visit with primary care provider (PCP) for complete physical and routine blood work. I recommend daily intake of vitamin D (400-800iu) and calcium (800-1000mg ) for bone health. Discuss Dexa screening with PCP.   According to [place] law, you can not drive unless you are seizure / syncope free for at least 6 months and under physician's care.  Please maintain precautions. Do not participate in activities where a loss of awareness could harm you or someone else. No swimming alone, no tub bathing, no hot tubs, no driving, no operating motorized vehicles (cars, ATVs, motocycles, etc), lawnmowers, power tools or firearms. No standing at heights, such as rooftops, ladders or stairs. Avoid hot objects such as stoves, heaters, open fires. Wear a helmet when riding a bicycle, scooter, skateboard, etc. and avoid areas of traffic. Set your water heater to 120 degrees or less.  SUDEP is the sudden, unexpected death of someone with epilepsy, who was otherwise healthy. In SUDEP cases, no other cause of death is found when an autopsy is done. Each year, more than 1 in 1,000 people with epilepsy die from SUDEP. This is the leading cause of death in people with uncontrolled seizures. Until further answers are available, the best way to prevent SUDEP is to lower your risk by controlling seizures. Research has found that people with all types of epilepsy that experience convulsive seizures can be at risk.  Please make sure you are staying well hydrated. I recommend 50-60 ounces daily. Well balanced diet and regular exercise encouraged. Consistent sleep schedule with 6-8 hours recommended.   Please continue follow up with care team as directed.   Follow up with me in 1 year   You may receive a survey regarding today's visit. I  encourage you to leave honest feed back as I do use this information to improve patient care. Thank you for seeing me today!
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
## 2022-12-06 ENCOUNTER — Other Ambulatory Visit (INDEPENDENT_AMBULATORY_CARE_PROVIDER_SITE_OTHER): Payer: Self-pay

## 2022-12-06 ENCOUNTER — Telehealth: Payer: No Typology Code available for payment source | Admitting: Family Medicine

## 2022-12-06 ENCOUNTER — Encounter: Payer: Self-pay | Admitting: Family Medicine

## 2022-12-06 ENCOUNTER — Other Ambulatory Visit: Payer: Self-pay | Admitting: Diagnostic Neuroimaging

## 2022-12-06 ENCOUNTER — Other Ambulatory Visit: Payer: Self-pay

## 2022-12-06 ENCOUNTER — Other Ambulatory Visit (HOSPITAL_COMMUNITY): Payer: Self-pay

## 2022-12-06 DIAGNOSIS — Z0289 Encounter for other administrative examinations: Secondary | ICD-10-CM

## 2022-12-06 DIAGNOSIS — R569 Unspecified convulsions: Secondary | ICD-10-CM | POA: Diagnosis not present

## 2022-12-06 DIAGNOSIS — G40109 Localization-related (focal) (partial) symptomatic epilepsy and epileptic syndromes with simple partial seizures, not intractable, without status epilepticus: Secondary | ICD-10-CM

## 2022-12-06 MED ORDER — CARBAMAZEPINE ER 400 MG PO TB12
400.0000 mg | ORAL_TABLET | Freq: Two times a day (BID) | ORAL | 4 refills | Status: DC
  Filled 2022-12-06: qty 180, 90d supply, fill #0
  Filled 2023-01-24 – 2023-03-08 (×2): qty 180, 90d supply, fill #1
  Filled 2023-05-31 (×2): qty 60, 30d supply, fill #2
  Filled 2023-07-03: qty 60, 30d supply, fill #3
  Filled 2023-07-07: qty 120, 60d supply, fill #3
  Filled 2023-07-07: qty 60, 30d supply, fill #3
  Filled 2023-10-03: qty 180, 90d supply, fill #4

## 2022-12-07 ENCOUNTER — Other Ambulatory Visit (HOSPITAL_COMMUNITY): Payer: Self-pay

## 2022-12-07 LAB — CBC WITH DIFFERENTIAL/PLATELET
Basophils Absolute: 0.1 10*3/uL (ref 0.0–0.2)
Basos: 1 %
EOS (ABSOLUTE): 0.1 10*3/uL (ref 0.0–0.4)
Eos: 2 %
Hematocrit: 33.1 % — ABNORMAL LOW (ref 37.5–51.0)
Hemoglobin: 11.6 g/dL — ABNORMAL LOW (ref 13.0–17.7)
Immature Grans (Abs): 0 10*3/uL (ref 0.0–0.1)
Immature Granulocytes: 0 %
Lymphocytes Absolute: 1.6 10*3/uL (ref 0.7–3.1)
Lymphs: 32 %
MCH: 32.2 pg (ref 26.6–33.0)
MCHC: 35 g/dL (ref 31.5–35.7)
MCV: 92 fL (ref 79–97)
Monocytes Absolute: 0.6 10*3/uL (ref 0.1–0.9)
Monocytes: 12 %
Neutrophils Absolute: 2.6 10*3/uL (ref 1.4–7.0)
Neutrophils: 53 %
Platelets: 325 10*3/uL (ref 150–450)
RBC: 3.6 x10E6/uL — ABNORMAL LOW (ref 4.14–5.80)
RDW: 12.8 % (ref 11.6–15.4)
WBC: 4.9 10*3/uL (ref 3.4–10.8)

## 2022-12-07 LAB — COMPREHENSIVE METABOLIC PANEL
ALT: 23 IU/L (ref 0–44)
AST: 26 IU/L (ref 0–40)
Albumin: 4.4 g/dL (ref 3.9–4.9)
Alkaline Phosphatase: 83 IU/L (ref 44–121)
BUN/Creatinine Ratio: 21 (ref 10–24)
BUN: 13 mg/dL (ref 8–27)
Bilirubin Total: 0.2 mg/dL (ref 0.0–1.2)
CO2: 25 mmol/L (ref 20–29)
Calcium: 9.3 mg/dL (ref 8.6–10.2)
Chloride: 93 mmol/L — ABNORMAL LOW (ref 96–106)
Creatinine, Ser: 0.63 mg/dL — ABNORMAL LOW (ref 0.76–1.27)
Globulin, Total: 2.4 g/dL (ref 1.5–4.5)
Glucose: 88 mg/dL (ref 70–99)
Potassium: 5 mmol/L (ref 3.5–5.2)
Sodium: 129 mmol/L — ABNORMAL LOW (ref 134–144)
Total Protein: 6.8 g/dL (ref 6.0–8.5)
eGFR: 107 mL/min/{1.73_m2} (ref >59)

## 2022-12-07 LAB — CARBAMAZEPINE LEVEL, TOTAL: Carbamazepine (Tegretol), S: 7.7 ug/mL (ref 4.0–12.0)

## 2022-12-08 ENCOUNTER — Other Ambulatory Visit (HOSPITAL_COMMUNITY): Payer: Self-pay

## 2023-01-24 ENCOUNTER — Other Ambulatory Visit (HOSPITAL_COMMUNITY): Payer: Self-pay

## 2023-01-24 ENCOUNTER — Other Ambulatory Visit: Payer: Self-pay

## 2023-01-28 ENCOUNTER — Other Ambulatory Visit (HOSPITAL_COMMUNITY): Payer: Self-pay

## 2023-02-27 ENCOUNTER — Other Ambulatory Visit (HOSPITAL_COMMUNITY): Payer: Self-pay

## 2023-03-04 ENCOUNTER — Other Ambulatory Visit (HOSPITAL_COMMUNITY): Payer: Self-pay

## 2023-03-08 ENCOUNTER — Other Ambulatory Visit (HOSPITAL_COMMUNITY): Payer: Self-pay

## 2023-03-09 ENCOUNTER — Other Ambulatory Visit (HOSPITAL_COMMUNITY): Payer: Self-pay

## 2023-03-10 ENCOUNTER — Other Ambulatory Visit (HOSPITAL_COMMUNITY): Payer: Self-pay

## 2023-04-22 ENCOUNTER — Other Ambulatory Visit (HOSPITAL_COMMUNITY): Payer: Self-pay

## 2023-04-24 ENCOUNTER — Other Ambulatory Visit (HOSPITAL_COMMUNITY): Payer: Self-pay

## 2023-04-24 MED ORDER — SERTRALINE HCL 50 MG PO TABS
50.0000 mg | ORAL_TABLET | Freq: Every day | ORAL | 1 refills | Status: DC
  Filled 2023-04-24: qty 90, 90d supply, fill #0
  Filled 2023-05-31: qty 90, 90d supply, fill #1
  Filled 2023-05-31: qty 30, 30d supply, fill #1
  Filled 2023-07-18: qty 30, 30d supply, fill #2
  Filled 2023-09-18: qty 30, 30d supply, fill #3

## 2023-04-26 ENCOUNTER — Other Ambulatory Visit (HOSPITAL_COMMUNITY): Payer: Self-pay

## 2023-05-31 ENCOUNTER — Other Ambulatory Visit (HOSPITAL_COMMUNITY): Payer: Self-pay

## 2023-07-03 ENCOUNTER — Other Ambulatory Visit (HOSPITAL_COMMUNITY): Payer: Self-pay

## 2023-07-04 ENCOUNTER — Other Ambulatory Visit (HOSPITAL_COMMUNITY): Payer: Self-pay

## 2023-07-07 ENCOUNTER — Other Ambulatory Visit: Payer: Self-pay

## 2023-07-07 ENCOUNTER — Other Ambulatory Visit (HOSPITAL_COMMUNITY): Payer: Self-pay

## 2023-07-10 ENCOUNTER — Other Ambulatory Visit (HOSPITAL_COMMUNITY): Payer: Self-pay

## 2023-07-10 ENCOUNTER — Other Ambulatory Visit: Payer: Self-pay

## 2023-07-11 ENCOUNTER — Other Ambulatory Visit: Payer: Self-pay

## 2023-07-18 ENCOUNTER — Other Ambulatory Visit (HOSPITAL_COMMUNITY): Payer: Self-pay

## 2023-07-19 ENCOUNTER — Other Ambulatory Visit (HOSPITAL_COMMUNITY): Payer: Self-pay

## 2023-07-24 ENCOUNTER — Other Ambulatory Visit (HOSPITAL_COMMUNITY): Payer: Self-pay

## 2023-09-14 ENCOUNTER — Telehealth: Admitting: Physician Assistant

## 2023-09-14 ENCOUNTER — Other Ambulatory Visit (HOSPITAL_COMMUNITY): Payer: Self-pay

## 2023-09-14 DIAGNOSIS — K2900 Acute gastritis without bleeding: Secondary | ICD-10-CM

## 2023-09-14 MED ORDER — PANTOPRAZOLE SODIUM 40 MG PO TBEC
40.0000 mg | DELAYED_RELEASE_TABLET | Freq: Every day | ORAL | 0 refills | Status: DC
Start: 2023-09-14 — End: 2024-02-05
  Filled 2023-09-14: qty 30, 30d supply, fill #0

## 2023-09-14 MED ORDER — SUCRALFATE 1 GM/10ML PO SUSP
1.0000 g | Freq: Three times a day (TID) | ORAL | 0 refills | Status: DC
Start: 2023-09-14 — End: 2024-02-05
  Filled 2023-09-14: qty 420, 11d supply, fill #0

## 2023-09-14 NOTE — Progress Notes (Signed)
 Virtual Visit Consent   Logan Kelley, you are scheduled for a virtual visit with a [facility] provider today. Just as with appointments in the office, your consent must be obtained to participate. Your consent will be active for this visit and any virtual visit you may have with one of our providers in the next 365 days. If you have a MyChart account, a copy of this consent can be sent to you electronically.  As this is a virtual visit, video technology does not allow for your provider to perform a traditional examination. This may limit your provider's ability to fully assess your condition. If your provider identifies any concerns that need to be evaluated in person or the need to arrange testing (such as labs, EKG, etc.), we will make arrangements to do so. Although advances in technology are sophisticated, we cannot ensure that it will always work on either your end or our end. If the connection with a video visit is poor, the visit may have to be switched to a telephone visit. With either a video or telephone visit, we are not always able to ensure that we have a secure connection.  By engaging in this virtual visit, you consent to the provision of healthcare and authorize for your insurance to be billed (if applicable) for the services provided during this visit. Depending on your insurance coverage, you may receive a charge related to this service.  I need to obtain your verbal consent now. Are you willing to proceed with your visit today? Logan Kelley has provided verbal consent on 09/14/2023 for a virtual visit (video or telephone). Piedad Climes, [place]  Date: 09/14/2023 12:15 PM   Virtual Visit via Video Note   I, Piedad Climes, connected with  Logan Kelley  ([id number], August 25, 1960) on 09/14/23 at 12:00 PM EDT by a video-enabled telemedicine application and verified that I am speaking with the correct person using two identifiers.  Location: Patient: Virtual Visit Location  Patient: Home Provider: Virtual Visit Location Provider: Home Office   I discussed the limitations of evaluation and management by telemedicine and the availability of in person appointments. The patient expressed understanding and agreed to proceed.    History of Present Illness: Logan Kelley is a 63 y.o. who identifies as a male who was assigned male at birth, and is being seen today for painful and difficulty swallowing. Notes symptoms starting overnight this past Monday.  Patient notes that evening he and his wife ate much later than normal, having a very large and spicy cheese steak.  The next morning he woke up to have his coffee and noticed substantial burning sensation along with some irritation while swallowing.  Notes since then he has had some intermittent heartburn.  Notes sometimes it is harder to eat solid foods because of some discomfort with swallowing.  No issue tolerating liquids.  Has been trying to stay well-hydrated.  Denies fever or chills.  Denies vomiting or blood in stool.  Has continued taking a daily probiotic, which is not new for him.   HPI: HPI  Problems:  Patient Active Problem List   Diagnosis Date Noted   Cervicalgia 12/03/2020    Allergies:  Allergies  Allergen Reactions   Bee Venom Swelling    SOB   Medications:  Current Outpatient Medications:    pantoprazole (PROTONIX) 40 MG tablet, Take 1 tablet (40 mg total) by mouth daily., Disp: 30 tablet, Rfl: 0   sucralfate (CARAFATE) 1 GM/10ML suspension,  Take 10 mLs (1 g total) by mouth 4 (four) times daily -  with meals and at bedtime for 7 days., Disp: 420 mL, Rfl: 0   aspirin EC 81 MG tablet, Take by mouth. Reported on 05/27/2015, Disp: , Rfl:    carbamazepine (TEGRETOL XR) 400 MG 12 hr tablet, Take 1 tablet (400 mg) by mouth 2 times daily., Disp: 180 tablet, Rfl: 4   cholecalciferol (VITAMIN D) 400 UNITS TABS tablet, Take 400 Units by mouth., Disp: , Rfl:    Multiple Vitamin (MULTIVITAMIN) capsule,  Reported on 05/27/2015, Disp: , Rfl:    sertraline (ZOLOFT) 50 MG tablet, Take 1 tablet (50 mg total) by mouth daily., Disp: 90 tablet, Rfl: 1   Turmeric (QC TUMERIC COMPLEX PO), Take by mouth daily. Dose unknown, Disp: , Rfl:   Observations/Objective: Patient is well-developed, well-nourished in no acute distress.  Resting comfortably at home.  Head is normocephalic, atraumatic.  No labored breathing. Speech is clear and coherent with logical content.  Patient is alert and oriented at baseline.   Assessment and Plan: 1. Other acute gastritis without hemorrhage (Primary) - sucralfate (CARAFATE) 1 GM/10ML suspension; Take 10 mLs (1 g total) by mouth 4 (four) times daily -  with meals and at bedtime for 7 days.  Dispense: 420 mL; Refill: 0 - pantoprazole (PROTONIX) 40 MG tablet; Take 1 tablet (40 mg total) by mouth daily.  Dispense: 30 tablet; Refill: 0  No alarm signs or symptoms present.  Supportive measures and OTC medications reviewed.  Avoid alcohol and NSAIDs.  Avoid caffeinated beverages.  GERD diet reviewed and handout sent in AVS.  Start Protonix once daily for at least 7 to 10 days.  Carafate per orders.  Follow-up in person for any nonresolving, new or worsening symptoms despite treatment.  Work note declined.  Follow Up Instructions: I discussed the assessment and treatment plan with the patient. The patient was provided an opportunity to ask questions and all were answered. The patient agreed with the plan and demonstrated an understanding of the instructions.  A copy of instructions were sent to the patient via MyChart unless otherwise noted below.   The patient was advised to call back or seek an in-person evaluation if the symptoms worsen or if the condition fails to improve as anticipated.    Piedad Climes, PA-C

## 2023-09-14 NOTE — Patient Instructions (Signed)
 Logan Kelley, thank you for joining Piedad Climes, PA-C for today's virtual visit.  While this provider is not your primary care provider (PCP), if your PCP is located in our provider database this encounter information will be shared with them immediately following your visit.   A [facility] MyChart account gives you access to today's visit and all your visits, tests, and labs performed at [facility] " click here if you don't have a [facility] MyChart account or go to [email or web address]  Consent: (Patient) Logan Kelley provided verbal consent for this virtual visit at the beginning of the encounter.  Current Medications:  Current Outpatient Medications:    aspirin EC 81 MG tablet, Take by mouth. Reported on 05/27/2015, Disp: , Rfl:    carbamazepine (TEGRETOL XR) 400 MG 12 hr tablet, Take 1 tablet (400 mg) by mouth 2 times daily., Disp: 180 tablet, Rfl: 4   cetirizine (ZYRTEC) 10 MG tablet, Take 1 tablet (10 mg total) by mouth daily as needed., Disp: 90 tablet, Rfl: 2   cholecalciferol (VITAMIN D) 400 UNITS TABS tablet, Take 400 Units by mouth., Disp: , Rfl:    Multiple Vitamin (MULTIVITAMIN) capsule, Reported on 05/27/2015, Disp: , Rfl:    sertraline (ZOLOFT) 50 MG tablet, Take 1 tablet (50 mg total) by mouth daily., Disp: 90 tablet, Rfl: 1   Turmeric (QC TUMERIC COMPLEX PO), Take by mouth daily. Dose unknown, Disp: , Rfl:    Medications ordered in this encounter:  No orders of the defined types were placed in this encounter.    *If you need refills on other medications prior to your next appointment, please contact your pharmacy*  Follow-Up: Call back or seek an in-person evaluation if the symptoms worsen or if the condition fails to improve as anticipated.  [facility] [phone]  Other Instructions Please keep well-hydrated and rest. Avoid late night eating. Elevate the head of your bed. Avoid medications like ibuprofen or  Aleve as they can irritate the stomach lining. Avoid alcohol and limit caffeine. Follow the dietary recommendations listed below. Take the Protonix once daily as directed for at least 7 to 10 days. Use the Carafate as directed. If symptoms are not resolving, or if you note any new or worsening symptoms despite treatment, you need to seek an in person evaluation ASAP.  Please do not delay care.  Feel better soon!  GERD in Adults: Diet Changes When you have gastroesophageal reflux disease (GERD), you may need to make changes to your diet. Choosing the right foods can help with your symptoms. Think about working with an expert in healthy eating called a dietitian. They can help you make healthy food choices. What are tips for following this plan? Reading food labels Look for foods that are low in saturated fat. Foods that may help with your symptoms include: Foods with less than 5% of daily value (DV) of fat. Foods with 0 grams of trans fat. Cooking [employer] in ways that don't use a lot of fat. These ways include: Baking. Steaming. Grilling. Broiling. To add flavor, try to use herbs that are low in spice and acidity. Avoid frying your food. Meal planning  Eat small meals often rather than eating 3 large meals each day. Eat your meals slowly in a place where you feel relaxed. If told by your health care provider, avoid: Foods that cause symptoms. Keep a food diary to keep track of foods that cause symptoms. Alcohol. Drinking a lot  of liquid with meals. General instructions For 2-3 hours after you eat, avoid: Bending over. Exercise. Lying down. Chew sugar-free gum after meals. What foods should I eat? Eat a healthy diet. Try to include: Foods with high amounts of fiber. These include: Fruits and vegetables. Whole grains and beans. Low-fat dairy products. Lean meats, fish, and poultry. Egg whites. Foods that cause symptoms in someone else may not cause symptoms for you.  Work with your provider to find foods that are safe for you. The items listed above may not be all the foods and drinks you can have. Talk with a dietitian to learn more. The items listed above may not be a complete list of foods and beverages you can eat and drink. Contact a dietitian for more information. What foods should I avoid? Limiting some of these foods may help with your symptoms. Each person is different. Talk with a dietitian or your provider to help you find the exact foods to avoid. Some of the foods to avoid may include: Fruits Fruits with a lot of acid in them. These may include citrus fruits, such as oranges, grapefruit, pineapple, and lemons. Vegetables Deep-fried vegetables, such as [place] fries. Vegetables, sauces, or toppings made with added fat and vegetables with acid in them. These may include tomatoes and tomato products, chili peppers, onions, garlic, and horseradish. Grains Pastries or quick breads with added fat. Meats and other proteins High-fat meats, such as fatty beef or pork, hot dogs, ribs, ham, sausage, salami, and bacon. Fried meat or protein, such as fried fish and fried chicken. Egg yolks. Fats and oils Butter. Margarine. Shortening. Ghee. Drinks Coffee and other drinks with caffeine in them. Fizzy and sugary drinks, such as soda and energy drinks. Fruit juice made with acidic fruits, such as orange or grapefruit. Tomato juice. Sweets and desserts Chocolate and cocoa. Donuts. Seasonings and condiments Mint, such as peppermint and spearmint. Condiments, herbs, or seasonings that cause symptoms. These may include curry, hot sauce, or vinegar-based salad dressings. The items listed above may not be all the foods and drinks you should avoid. Talk with a dietitian to learn more. Questions to ask your health care provider Changes to your diet and everyday life are often the first steps taken to manage symptoms of GERD. If these changes don't help, talk with  your provider about taking medicines. Where to find more information International [employer] for Gastrointestinal Disorders: aboutgerd.org This information is not intended to replace advice given to you by your health care provider. Make sure you discuss any questions you have with your health care provider. Document Revised: 04/11/2023 Document Reviewed: 10/26/2022 Elsevier Patient Education  2024 Elsevier Inc.   If you have been instructed to have an in-person evaluation today at a local Urgent Care facility, please use the link below. It will take you to a list of all of our available [facility], including address, phone number and hours of operation. Please do not delay care.  [facility]  If you or a family member do not have a primary care provider, use the link below to schedule a visit and establish care. When you choose a [facility] primary care physician or advanced practice provider, you gain a long-term partner in health. Find a Primary Care Provider  Learn more about [facility]'s in-office and virtual care options:  - Get Care Now

## 2023-10-03 ENCOUNTER — Other Ambulatory Visit (HOSPITAL_COMMUNITY): Payer: Self-pay

## 2023-10-04 ENCOUNTER — Other Ambulatory Visit (HOSPITAL_COMMUNITY): Payer: Self-pay

## 2023-10-04 ENCOUNTER — Other Ambulatory Visit: Payer: Self-pay

## 2023-10-16 ENCOUNTER — Other Ambulatory Visit (HOSPITAL_COMMUNITY): Payer: Self-pay

## 2023-10-16 MED ORDER — SERTRALINE HCL 50 MG PO TABS
50.0000 mg | ORAL_TABLET | Freq: Every day | ORAL | 1 refills | Status: DC
  Filled 2023-10-16: qty 90, 90d supply, fill #0
  Filled 2024-01-24 (×2): qty 90, 90d supply, fill #1

## 2023-12-31 ENCOUNTER — Other Ambulatory Visit: Payer: Self-pay

## 2024-01-02 ENCOUNTER — Other Ambulatory Visit: Payer: Self-pay

## 2024-01-09 ENCOUNTER — Other Ambulatory Visit: Payer: Self-pay

## 2024-01-10 ENCOUNTER — Other Ambulatory Visit (HOSPITAL_COMMUNITY): Payer: Self-pay

## 2024-01-12 ENCOUNTER — Other Ambulatory Visit: Payer: Self-pay

## 2024-01-14 ENCOUNTER — Other Ambulatory Visit: Payer: Self-pay

## 2024-01-15 ENCOUNTER — Other Ambulatory Visit: Payer: Self-pay

## 2024-01-15 ENCOUNTER — Telehealth: Payer: Self-pay | Admitting: Family Medicine

## 2024-01-15 ENCOUNTER — Other Ambulatory Visit (HOSPITAL_COMMUNITY): Payer: Self-pay

## 2024-01-15 MED ORDER — CARBAMAZEPINE ER 400 MG PO TB12
400.0000 mg | ORAL_TABLET | Freq: Two times a day (BID) | ORAL | 0 refills | Status: DC
  Filled 2024-01-15: qty 60, 30d supply, fill #0

## 2024-01-15 NOTE — Telephone Encounter (Signed)
 Patient request refill for carbamazepine  (TEGRETOL  XR) 400 MG 12 hr tablet send to  [facility]   Scheduled appointment on 02/05/24 at 8:30 am

## 2024-01-15 NOTE — Telephone Encounter (Signed)
 Last seen 12/06/22 by AL,NP. Next follow up 02/05/24. Last refilled 10/09/23 #180.   Per last note: Logan Kelley is doing well. No recent seizures. Last seizure 10/2020 in setting of missed ASM. He will continue carbamazepine  XR 400mg  BID. We will update labs, today. He will continue close follow up with PCP and psychiatry. He was advised to avoid missed doses of AED. Healthy lifestyle habits encouraged. He will return for follow up in 1 year, sooner if needed.   E-scribed refill to pharmacy.

## 2024-01-24 ENCOUNTER — Other Ambulatory Visit (HOSPITAL_COMMUNITY): Payer: Self-pay

## 2024-01-30 ENCOUNTER — Other Ambulatory Visit: Payer: Self-pay | Admitting: Medical Genetics

## 2024-01-30 ENCOUNTER — Encounter (INDEPENDENT_AMBULATORY_CARE_PROVIDER_SITE_OTHER): Payer: Self-pay
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
## 2024-02-02 NOTE — Patient Instructions (Addendum)
 Below is our plan:  We will continue carbamazepine  XR 800mg  at bedtime  Please make sure you are consistent with timing of seizure medication. I recommend annual visit with primary care provider (PCP) for complete physical and routine blood work. I recommend daily intake of vitamin D (400-800iu) and calcium (800-1000mg ) for bone health. Discuss Dexa screening with PCP.   According to [place] law, you can not drive unless you are seizure / syncope free for at least 6 months and under physician's care.  Please maintain precautions. Do not participate in activities where a loss of awareness could harm you or someone else. No swimming alone, no tub bathing, no hot tubs, no driving, no operating motorized vehicles (cars, ATVs, motocycles, etc), lawnmowers, power tools or firearms. No standing at heights, such as rooftops, ladders or stairs. Avoid hot objects such as stoves, heaters, open fires. Wear a helmet when riding a bicycle, scooter, skateboard, etc. and avoid areas of traffic. Set your water heater to 120 degrees or less.  SUDEP is the sudden, unexpected death of someone with epilepsy, who was otherwise healthy. In SUDEP cases, no other cause of death is found when an autopsy is done. Each year, more than 1 in 1,000 people with epilepsy die from SUDEP. This is the leading cause of death in people with uncontrolled seizures. Until further answers are available, the best way to prevent SUDEP is to lower your risk by controlling seizures. Research has found that people with all types of epilepsy that experience convulsive seizures can be at risk.  Please make sure you are staying well hydrated. I recommend 50-60 ounces daily. Well balanced diet and regular exercise encouraged. Consistent sleep schedule with 6-8 hours recommended.   Please continue follow up with care team as directed.   Follow up with me in 1 year   You may receive a survey regarding today's visit. I encourage you to leave honest feed  back as I do use this information to improve patient care. Thank you for seeing me today!

## 2024-02-05 ENCOUNTER — Encounter: Payer: Self-pay | Admitting: Family Medicine

## 2024-02-05 ENCOUNTER — Other Ambulatory Visit (HOSPITAL_COMMUNITY): Payer: Self-pay

## 2024-02-05 ENCOUNTER — Ambulatory Visit: Admitting: Family Medicine

## 2024-02-05 VITALS — BP 150/80 | HR 71 | Ht 71.0 in | Wt 176.0 lb

## 2024-02-05 DIAGNOSIS — G40109 Localization-related (focal) (partial) symptomatic epilepsy and epileptic syndromes with simple partial seizures, not intractable, without status epilepticus: Secondary | ICD-10-CM

## 2024-02-05 DIAGNOSIS — D649 Anemia, unspecified: Secondary | ICD-10-CM

## 2024-02-05 DIAGNOSIS — Z5181 Encounter for therapeutic drug level monitoring: Secondary | ICD-10-CM | POA: Diagnosis not present

## 2024-02-05 MED ORDER — CARBAMAZEPINE ER 400 MG PO TB12
400.0000 mg | ORAL_TABLET | Freq: Two times a day (BID) | ORAL | 3 refills | Status: AC
  Filled 2024-02-05 – 2024-02-09 (×2): qty 180, 90d supply, fill #0
  Filled 2024-04-16 – 2024-04-17 (×2): qty 180, 90d supply, fill #1

## 2024-02-07 ENCOUNTER — Other Ambulatory Visit (HOSPITAL_COMMUNITY): Payer: Self-pay

## 2024-02-07 ENCOUNTER — Ambulatory Visit: Payer: Self-pay | Admitting: Family Medicine

## 2024-02-07 LAB — CBC WITH DIFFERENTIAL/PLATELET
Basophils Absolute: 0 x10E3/uL (ref 0.0–0.2)
Basos: 1 %
EOS (ABSOLUTE): 0.1 x10E3/uL (ref 0.0–0.4)
Eos: 2 %
Hematocrit: 42.7 % (ref 37.5–51.0)
Hemoglobin: 14 g/dL (ref 13.0–17.7)
Immature Grans (Abs): 0 x10E3/uL (ref 0.0–0.1)
Immature Granulocytes: 0 %
Lymphocytes Absolute: 1.4 x10E3/uL (ref 0.7–3.1)
Lymphs: 38 %
MCH: 32.9 pg (ref 26.6–33.0)
MCHC: 32.8 g/dL (ref 31.5–35.7)
MCV: 100 fL — ABNORMAL HIGH (ref 79–97)
Monocytes Absolute: 0.5 x10E3/uL (ref 0.1–0.9)
Monocytes: 12 %
Neutrophils Absolute: 1.8 x10E3/uL (ref 1.4–7.0)
Neutrophils: 46 %
Platelets: 271 x10E3/uL (ref 150–450)
RBC: 4.26 x10E6/uL (ref 4.14–5.80)
RDW: 13 % (ref 11.6–15.4)
WBC: 3.8 x10E3/uL (ref 3.4–10.8)

## 2024-02-07 LAB — COMPREHENSIVE METABOLIC PANEL WITH GFR
ALT: 28 IU/L (ref 0–44)
AST: 27 IU/L (ref 0–40)
Albumin: 4.6 g/dL (ref 3.9–4.9)
Alkaline Phosphatase: 78 IU/L (ref 44–121)
BUN/Creatinine Ratio: 23 (ref 10–24)
BUN: 14 mg/dL (ref 8–27)
Bilirubin Total: 0.3 mg/dL (ref 0.0–1.2)
CO2: 22 mmol/L (ref 20–29)
Calcium: 9.7 mg/dL (ref 8.6–10.2)
Chloride: 94 mmol/L — ABNORMAL LOW (ref 96–106)
Creatinine, Ser: 0.62 mg/dL — ABNORMAL LOW (ref 0.76–1.27)
Globulin, Total: 2.7 g/dL (ref 1.5–4.5)
Glucose: 86 mg/dL (ref 70–99)
Potassium: 4.7 mmol/L (ref 3.5–5.2)
Sodium: 133 mmol/L — ABNORMAL LOW (ref 134–144)
Total Protein: 7.3 g/dL (ref 6.0–8.5)
eGFR: 107 mL/min/1.73 (ref >59)

## 2024-02-07 LAB — CARBAMAZEPINE LEVEL, TOTAL: Carbamazepine (Tegretol), S: 8.7 ug/mL (ref 4.0–12.0)

## 2024-02-09 ENCOUNTER — Other Ambulatory Visit (HOSPITAL_COMMUNITY): Payer: Self-pay

## 2024-02-09 ENCOUNTER — Other Ambulatory Visit: Payer: Self-pay

## 2024-02-13 ENCOUNTER — Other Ambulatory Visit (HOSPITAL_COMMUNITY): Payer: Self-pay

## 2024-03-15 ENCOUNTER — Other Ambulatory Visit (HOSPITAL_COMMUNITY): Payer: Self-pay

## 2024-03-20 ENCOUNTER — Other Ambulatory Visit (HOSPITAL_COMMUNITY)
Admission: RE | Admit: 2024-03-20 | Discharge: 2024-03-20 | Disposition: A | Discharge status: Home / Self Care | Payer: Self-pay | Source: Ambulatory Visit | Attending: Medical Genetics | Admitting: Medical Genetics

## 2024-03-30 LAB — GENECONNECT MOLECULAR SCREEN: Genetic Analysis Overall Interpretation: NEGATIVE

## 2024-04-16 ENCOUNTER — Other Ambulatory Visit (HOSPITAL_COMMUNITY): Payer: Self-pay

## 2024-04-16 MED ORDER — SERTRALINE HCL 50 MG PO TABS
50.0000 mg | ORAL_TABLET | Freq: Every day | ORAL | 0 refills | Status: AC
  Filled 2024-04-16: qty 90, 90d supply, fill #0

## 2024-04-17 ENCOUNTER — Other Ambulatory Visit: Payer: Self-pay

## 2024-04-17 ENCOUNTER — Other Ambulatory Visit (HOSPITAL_COMMUNITY): Payer: Self-pay

## 2024-04-18 ENCOUNTER — Other Ambulatory Visit: Payer: Self-pay

## 2024-06-27 ENCOUNTER — Other Ambulatory Visit (HOSPITAL_COMMUNITY): Payer: Self-pay

## 2024-06-27 MED ORDER — ALBUTEROL SULFATE HFA 108 (90 BASE) MCG/ACT IN AERS
2.0000 | INHALATION_SPRAY | RESPIRATORY_TRACT | 1 refills | Status: AC
  Filled 2024-06-27: qty 6.7, 20d supply, fill #0

## 2024-06-27 MED ORDER — GUAIFENESIN-CODEINE 100-10 MG/5ML PO SOLN
10.0000 mL | Freq: Three times a day (TID) | ORAL | 0 refills | Status: AC | PRN
  Filled 2024-06-27: qty 60, 2d supply, fill #0

## 2024-07-04 ENCOUNTER — Other Ambulatory Visit (HOSPITAL_COMMUNITY): Payer: Self-pay

## 2024-07-04 ENCOUNTER — Other Ambulatory Visit (HOSPITAL_BASED_OUTPATIENT_CLINIC_OR_DEPARTMENT_OTHER): Payer: Self-pay

## 2024-07-04 MED ORDER — SERTRALINE HCL 100 MG PO TABS
100.0000 mg | ORAL_TABLET | Freq: Every day | ORAL | 0 refills | Status: AC
  Filled 2024-07-04 (×2): qty 90, 90d supply, fill #0

## 2025-02-10 ENCOUNTER — Ambulatory Visit: Admitting: Family Medicine
